# Patient Record
Sex: Female | Born: 1975 | Race: Black or African American | Hispanic: No | Marital: Single | Smoking: Former smoker
Health system: Southern US, Community
[De-identification: ages and names within clinical notes are randomized; demographics above are authoritative.]

## PROBLEM LIST (undated history)

## (undated) ENCOUNTER — Inpatient Hospital Stay (HOSPITAL_COMMUNITY): Payer: Self-pay

## (undated) DIAGNOSIS — R87629 Unspecified abnormal cytological findings in specimens from vagina: Secondary | ICD-10-CM

## (undated) DIAGNOSIS — O139 Gestational [pregnancy-induced] hypertension without significant proteinuria, unspecified trimester: Secondary | ICD-10-CM

## (undated) DIAGNOSIS — F32A Depression, unspecified: Secondary | ICD-10-CM

## (undated) DIAGNOSIS — D649 Anemia, unspecified: Secondary | ICD-10-CM

## (undated) DIAGNOSIS — F419 Anxiety disorder, unspecified: Secondary | ICD-10-CM

## (undated) DIAGNOSIS — F329 Major depressive disorder, single episode, unspecified: Secondary | ICD-10-CM

## (undated) HISTORY — PX: NO PAST SURGERIES: SHX2092

---

## 2016-04-04 ENCOUNTER — Inpatient Hospital Stay (HOSPITAL_COMMUNITY): Payer: Self-pay

## 2016-04-04 ENCOUNTER — Inpatient Hospital Stay (HOSPITAL_COMMUNITY)
Admission: AD | Admit: 2016-04-04 | Discharge: 2016-04-04 | Disposition: A | Discharge status: Home / Self Care | Payer: Self-pay | Source: Ambulatory Visit | Attending: Family Medicine | Admitting: Family Medicine

## 2016-04-04 ENCOUNTER — Encounter (HOSPITAL_COMMUNITY): Payer: Self-pay | Admitting: *Deleted

## 2016-04-04 DIAGNOSIS — O26892 Other specified pregnancy related conditions, second trimester: Secondary | ICD-10-CM

## 2016-04-04 DIAGNOSIS — K219 Gastro-esophageal reflux disease without esophagitis: Secondary | ICD-10-CM | POA: Insufficient documentation

## 2016-04-04 DIAGNOSIS — O99332 Smoking (tobacco) complicating pregnancy, second trimester: Secondary | ICD-10-CM | POA: Insufficient documentation

## 2016-04-04 DIAGNOSIS — O99612 Diseases of the digestive system complicating pregnancy, second trimester: Secondary | ICD-10-CM | POA: Insufficient documentation

## 2016-04-04 DIAGNOSIS — R109 Unspecified abdominal pain: Secondary | ICD-10-CM

## 2016-04-04 DIAGNOSIS — O99119 Other diseases of the blood and blood-forming organs and certain disorders involving the immune mechanism complicating pregnancy, unspecified trimester: Secondary | ICD-10-CM

## 2016-04-04 DIAGNOSIS — O0932 Supervision of pregnancy with insufficient antenatal care, second trimester: Secondary | ICD-10-CM | POA: Insufficient documentation

## 2016-04-04 DIAGNOSIS — O0930 Supervision of pregnancy with insufficient antenatal care, unspecified trimester: Secondary | ICD-10-CM

## 2016-04-04 DIAGNOSIS — O1202 Gestational edema, second trimester: Secondary | ICD-10-CM | POA: Insufficient documentation

## 2016-04-04 DIAGNOSIS — D649 Anemia, unspecified: Secondary | ICD-10-CM | POA: Insufficient documentation

## 2016-04-04 DIAGNOSIS — O26899 Other specified pregnancy related conditions, unspecified trimester: Secondary | ICD-10-CM

## 2016-04-04 DIAGNOSIS — O99012 Anemia complicating pregnancy, second trimester: Secondary | ICD-10-CM | POA: Insufficient documentation

## 2016-04-04 DIAGNOSIS — D696 Thrombocytopenia, unspecified: Secondary | ICD-10-CM | POA: Insufficient documentation

## 2016-04-04 DIAGNOSIS — Z3492 Encounter for supervision of normal pregnancy, unspecified, second trimester: Secondary | ICD-10-CM

## 2016-04-04 DIAGNOSIS — O99112 Other diseases of the blood and blood-forming organs and certain disorders involving the immune mechanism complicating pregnancy, second trimester: Secondary | ICD-10-CM | POA: Insufficient documentation

## 2016-04-04 DIAGNOSIS — Z3A19 19 weeks gestation of pregnancy: Secondary | ICD-10-CM | POA: Insufficient documentation

## 2016-04-04 HISTORY — DX: Gestational (pregnancy-induced) hypertension without significant proteinuria, unspecified trimester: O13.9

## 2016-04-04 HISTORY — DX: Depression, unspecified: F32.A

## 2016-04-04 HISTORY — DX: Anxiety disorder, unspecified: F41.9

## 2016-04-04 HISTORY — DX: Major depressive disorder, single episode, unspecified: F32.9

## 2016-04-04 HISTORY — DX: Unspecified abnormal cytological findings in specimens from vagina: R87.629

## 2016-04-04 LAB — COMPREHENSIVE METABOLIC PANEL
ALK PHOS: 53 U/L (ref 38–126)
ALT: 12 U/L — ABNORMAL LOW (ref 14–54)
ANION GAP: 7 (ref 5–15)
AST: 18 U/L (ref 15–41)
Albumin: 3.4 g/dL — ABNORMAL LOW (ref 3.5–5.0)
BUN: 6 mg/dL (ref 6–20)
CALCIUM: 8.8 mg/dL — AB (ref 8.9–10.3)
CO2: 23 mmol/L (ref 22–32)
Chloride: 106 mmol/L (ref 101–111)
Creatinine, Ser: 0.58 mg/dL (ref 0.44–1.00)
GFR calc non Af Amer: >60 mL/min (ref >60)
Glucose, Bld: 95 mg/dL (ref 65–99)
Potassium: 3.6 mmol/L (ref 3.5–5.1)
SODIUM: 136 mmol/L (ref 135–145)
TOTAL PROTEIN: 6.8 g/dL (ref 6.5–8.1)
Total Bilirubin: 0.4 mg/dL (ref 0.3–1.2)

## 2016-04-04 LAB — CBC
HCT: 30.1 % — ABNORMAL LOW (ref 36.0–46.0)
HEMOGLOBIN: 10.2 g/dL — AB (ref 12.0–15.0)
MCH: 31.2 pg (ref 26.0–34.0)
MCHC: 33.9 g/dL (ref 30.0–36.0)
MCV: 92 fL (ref 78.0–100.0)
Platelets: 122 10*3/uL — ABNORMAL LOW (ref 150–400)
RBC: 3.27 MIL/uL — ABNORMAL LOW (ref 3.87–5.11)
RDW: 12.9 % (ref 11.5–15.5)
WBC: 7.8 10*3/uL (ref 4.0–10.5)

## 2016-04-04 LAB — URINALYSIS, ROUTINE W REFLEX MICROSCOPIC
Bilirubin Urine: NEGATIVE
Glucose, UA: NEGATIVE mg/dL
Hgb urine dipstick: NEGATIVE
Ketones, ur: NEGATIVE mg/dL
LEUKOCYTES UA: NEGATIVE
Nitrite: NEGATIVE
PROTEIN: NEGATIVE mg/dL
Specific Gravity, Urine: 1.011 (ref 1.005–1.030)
pH: 7 (ref 5.0–8.0)

## 2016-04-04 LAB — TYPE AND SCREEN
ABO/RH(D): A POS
Antibody Screen: NEGATIVE

## 2016-04-04 LAB — WET PREP, GENITAL
SPERM: NONE SEEN
TRICH WET PREP: NONE SEEN
YEAST WET PREP: NONE SEEN

## 2016-04-04 LAB — ABO/RH: ABO/RH(D): A POS

## 2016-04-04 LAB — HEPATITIS B SURFACE ANTIGEN: Hepatitis B Surface Ag: NEGATIVE

## 2016-04-04 LAB — LIPASE, BLOOD: Lipase: 30 U/L (ref 11–51)

## 2016-04-04 MED ORDER — RANITIDINE HCL 150 MG PO TABS: 150.0000 mg | ORAL_TABLET | Freq: Every day | ORAL | 1 refills | Status: AC

## 2016-04-04 MED ORDER — GI COCKTAIL ~~LOC~~
30.0000 mL | Freq: Once | ORAL | Status: AC
  Administered 2016-04-04: 30 mL via ORAL
  Filled 2016-04-04: qty 30

## 2016-04-04 MED ORDER — FERROUS SULFATE 325 (65 FE) MG PO TABS: 325.0000 mg | ORAL_TABLET | Freq: Every day | ORAL | 3 refills | Status: DC

## 2016-04-04 NOTE — MAU Note (Signed)
Woke up, was swollen in hands, legs and feet.  Has been having pain on lower left side, comes and goes- started on Sat. Is nauseous all day. Lower back aches. No  Prenatal care yet, has appt to get medicaid certified at the end of the month.

## 2016-04-04 NOTE — Discharge Instructions (Signed)
Pregnancy and Anemia Anemia is a condition in which the concentration of red blood cells or hemoglobin in the blood is below normal. Hemoglobin is a substance in red blood cells that carries oxygen to the tissues of the body. Anemia results in not enough oxygen reaching these tissues. Anemia during pregnancy is common because the fetus uses more iron and folic acid as it is developing. Your body may not produce enough red blood cells because of this. Also, during pregnancy, the liquid part of the blood (plasma) increases by about 50%, and the red blood cells increase by only 25%. This lowers the concentration of the red blood cells and creates a natural anemia-like situation. What are the causes? The most common cause of anemia during pregnancy is not having enough iron in the body to make red blood cells (iron deficiency anemia). Other causes may include:  Folic acid deficiency.  Vitamin B12 deficiency.  Certain prescription or over-the-counter medicines.  Certain medical conditions or infections that destroy red blood cells.  A low platelet count and bleeding caused by antibodies that go through the placenta to the fetus from the mothers blood. What are the signs or symptoms? Mild anemia may not be noticeable. If it becomes severe, symptoms may include:  Tiredness.  Shortness of breath, especially with exercise.  Weakness.  Fainting.  Pale looking skin.  Headaches.  Feeling a fast or irregular heartbeat (palpitations). How is this diagnosed? The type of anemia is usually diagnosed from your family and medical history and blood tests. How is this treated? Treatment of anemia during pregnancy depends on the cause of the anemia. Treatment can include:  Supplements of iron, vitamin B12, or folic acid.  A blood transfusion. This may be needed if blood loss is severe.  Hospitalization. This may be needed if there is significant continual blood loss.  Dietary changes. Follow  these instructions at home:  Follow your dietitian's or health care provider's dietary recommendations.  Increase your vitamin C intake. This will help the stomach absorb more iron.  Eat a diet rich in iron. This would include foods such as:  Liver.  Beef.  Whole grain bread.  Eggs.  Dried fruit.  Take iron and vitamins as directed by your health care provider.  Eat green leafy vegetables. These are a good source of folic acid. Contact a health care provider if:  You have frequent or lasting headaches.  You are looking pale.  You are bruising easily. Get help right away if:  You have extreme weakness, shortness of breath, or chest pain.  You become dizzy or have trouble concentrating.  You have heavy vaginal bleeding.  You develop a rash.  You have bloody or black, tarry stools.  You faint.  You vomit up blood.  You vomit repeatedly.  You have abdominal pain.  You have a fever or persistent symptoms for more than 2-3 days.  You have a fever and your symptoms suddenly get worse.  You are dehydrated. This information is not intended to replace advice given to you by your health care provider. Make sure you discuss any questions you have with your health care provider. Document Released: 04/13/2000 Document Revised: 09/22/2015 Document Reviewed: 11/26/2012 Elsevier Interactive Patient Education  2017 Elsevier Inc. Gastroesophageal Reflux Disease, Adult Introduction Normally, food travels down the esophagus and stays in the stomach to be digested. If a person has gastroesophageal reflux disease (GERD), food and stomach acid move back up into the esophagus. When this happens, the esophagus becomes  sore and swollen (inflamed). Over time, GERD can make small holes (ulcers) in the lining of the esophagus. Follow these instructions at home: Diet  Follow a diet as told by your doctor. You may need to avoid foods and drinks such as:  Coffee and tea (with or  without caffeine).  Drinks that contain alcohol.  Energy drinks and sports drinks.  Carbonated drinks or sodas.  Chocolate and cocoa.  Peppermint and mint flavorings.  Garlic and onions.  Horseradish.  Spicy and acidic foods, such as peppers, chili powder, curry powder, vinegar, hot sauces, and BBQ sauce.  Citrus fruit juices and citrus fruits, such as oranges, lemons, and limes.  Tomato-based foods, such as red sauce, chili, salsa, and pizza with red sauce.  Fried and fatty foods, such as donuts, french fries, potato chips, and high-fat dressings.  High-fat meats, such as hot dogs, rib eye steak, sausage, ham, and bacon.  High-fat dairy items, such as whole milk, butter, and cream cheese.  Eat small meals often. Avoid eating large meals.  Avoid drinking large amounts of liquid with your meals.  Avoid eating meals during the 2-3 hours before bedtime.  Avoid lying down right after you eat.  Do not exercise right after you eat. General instructions  Pay attention to any changes in your symptoms.  Take over-the-counter and prescription medicines only as told by your doctor. Do not take aspirin, ibuprofen, or other NSAIDs unless your doctor says it is okay.  Do not use any tobacco products, including cigarettes, chewing tobacco, and e-cigarettes. If you need help quitting, ask your doctor.  Wear loose clothes. Do not wear anything tight around your waist.  Raise (elevate) the head of your bed about 6 inches (15 cm).  Try to lower your stress. If you need help doing this, ask your doctor.  If you are overweight, lose an amount of weight that is healthy for you. Ask your doctor about a safe weight loss goal.  Keep all follow-up visits as told by your doctor. This is important. Contact a doctor if:  You have new symptoms.  You lose weight and you do not know why it is happening.  You have trouble swallowing, or it hurts to swallow.  You have wheezing or a cough  that keeps happening.  Your symptoms do not get better with treatment.  You have a hoarse voice. Get help right away if:  You have pain in your arms, neck, jaw, teeth, or back.  You feel sweaty, dizzy, or light-headed.  You have chest pain or shortness of breath.  You throw up (vomit) and your throw up looks like blood or coffee grounds.  You pass out (faint).  Your poop (stool) is bloody or black.  You cannot swallow, drink, or eat. This information is not intended to replace advice given to you by your health care provider. Make sure you discuss any questions you have with your health care provider. Document Released: 10/03/2007 Document Revised: 09/22/2015 Document Reviewed: 08/11/2014  2017 Elsevier

## 2016-04-04 NOTE — MAU Provider Note (Signed)
History     CSN: [id number]  Arrival date and time: 04/04/16 4098   First Provider Initiated Contact with Patient 04/04/16 1033      Chief Complaint  Patient presents with  . Leg Swelling  . side pain  . Back Pain  . Nausea   [id number] @[redacted]w[redacted]d  by unsure LMP here with swelling of hands and feet and abdominal pain. She reports onset of pain this am. She describes as constant with intermittent worsening in the left mid abdomen. She reports nausea since last month, no vomiting this week. She denies diarrhea but reports loose stools x4 days. Swelling also started this am. She denies fever and chills. She endorses dysuria x4 days. She denies urgency, frequency, and hematuria. She has not started [facility] to date as awaiting pregnancy Medicaid. She reports hx of elevated BP and low platelets with her 4th pregnancy. All others pregnancies were term, uncomplicaed with SVDs.    OB History    Gravida Para Term Preterm AB Living   6 5 5  0 0 4   SAB TAB Ectopic Multiple Live Births   0 0 0 0 5      Past Medical History:  Diagnosis Date  . Anxiety    after 5th preg  . Depression    after death of child, ok now  . Pregnancy induced hypertension   . Vaginal Pap smear, abnormal    ok since    Past Surgical History:  Procedure Laterality Date  . NO PAST SURGERIES      Family History  Problem Relation Age of Onset  . Hypertension Mother   . Diabetes Mother   . Diabetes Maternal Grandmother   . Stroke Maternal Grandfather     Social History  Substance Use Topics  . Smoking status: Current Some Day Smoker  . Smokeless tobacco: Never Used     Comment: rare, social  . Alcohol use No    Allergies: Allergies not on file  No prescriptions prior to admission.    Review of Systems  Constitutional: Negative.   Gastrointestinal: Positive for abdominal pain and nausea. Negative for constipation, diarrhea and vomiting.  Genitourinary: Positive for dysuria. Negative for flank pain,  frequency, hematuria and urgency.   Physical Exam   Blood pressure 130/72, pulse 75, temperature 98.2 F (36.8 C), temperature source Oral, resp. rate 18, weight 73.1 kg (161 lb 4 oz). Temp:  [98.2 F (36.8 C)] 98.2 F (36.8 C) (12/06 1013) Pulse Rate:  [71-75] 74 (12/06 1354) Resp:  [16-18] 16 (12/06 1354) BP: (124-130)/(72-73) 124/72 (12/06 1354) Weight:  [73.1 kg (161 lb 4 oz)] 73.1 kg (161 lb 4 oz) (12/06 1013)  Physical Exam  Constitutional: She is oriented to person, place, and time. She appears well-developed and well-nourished. No distress (appears comfortable).  HENT:  Head: Normocephalic and atraumatic.  Neck: Normal range of motion. Neck supple.  Cardiovascular: Normal rate.   Respiratory: Effort normal.  GI: Soft. She exhibits no distension and no mass. There is no tenderness. There is no rebound and no guarding.  gravid  Genitourinary:  Genitourinary Comments: External: no lesions or erythema Vagina: rugated, parous, thin white discharge Uterus: enlarged SVE: closed/uneffaced  Musculoskeletal: Normal range of motion. She exhibits no edema.  Neurological: She is alert and oriented to person, place, and time.  Skin: Skin is warm and dry.  Psychiatric: She has a normal mood and affect.  FHT: 148 bpm  Results for orders placed or performed during the hospital encounter of 04/04/16 (  from the past 24 hour(s))  Wet prep, genital     Status: Abnormal   Collection Time: 04/04/16 10:52 AM  Result Value Ref Range   Yeast Wet Prep HPF POC NONE SEEN NONE SEEN   Trich, Wet Prep NONE SEEN NONE SEEN   Clue Cells Wet Prep HPF POC PRESENT (A) NONE SEEN   WBC, Wet Prep HPF POC MANY (A) NONE SEEN   Sperm NONE SEEN   Urinalysis, Routine w reflex microscopic     Status: None   Collection Time: 04/04/16 11:07 AM  Result Value Ref Range   Color, Urine YELLOW YELLOW   APPearance CLEAR CLEAR   Specific Gravity, Urine 1.011 1.005 - 1.030   pH 7.0 5.0 - 8.0   Glucose, UA NEGATIVE  NEGATIVE mg/dL   Hgb urine dipstick NEGATIVE NEGATIVE   Bilirubin Urine NEGATIVE NEGATIVE   Ketones, ur NEGATIVE NEGATIVE mg/dL   Protein, ur NEGATIVE NEGATIVE mg/dL   Nitrite NEGATIVE NEGATIVE   Leukocytes, UA NEGATIVE NEGATIVE  CBC     Status: Abnormal   Collection Time: 04/04/16 11:09 AM  Result Value Ref Range   WBC 7.8 4.0 - 10.5 K/uL   RBC 3.27 (L) 3.87 - 5.11 MIL/uL   Hemoglobin 10.2 (L) 12.0 - 15.0 g/dL   HCT 78.230.1 (L) 95.636.0 - 21.346.0 %   MCV 92.0 78.0 - 100.0 fL   MCH 31.2 26.0 - 34.0 pg   MCHC 33.9 30.0 - 36.0 g/dL   RDW 08.612.9 57.811.5 - 46.915.5 %   Platelets 122 (L) 150 - 400 K/uL  Comprehensive metabolic panel     Status: Abnormal   Collection Time: 04/04/16 11:09 AM  Result Value Ref Range   Sodium 136 135 - 145 mmol/L   Potassium 3.6 3.5 - 5.1 mmol/L   Chloride 106 101 - 111 mmol/L   CO2 23 22 - 32 mmol/L   Glucose, Bld 95 65 - 99 mg/dL   BUN 6 6 - 20 mg/dL   Creatinine, Ser 6.290.58 0.44 - 1.00 mg/dL   Calcium 8.8 (L) 8.9 - 10.3 mg/dL   Total Protein 6.8 6.5 - 8.1 g/dL   Albumin 3.4 (L) 3.5 - 5.0 g/dL   AST 18 15 - 41 U/L   ALT 12 (L) 14 - 54 U/L   Alkaline Phosphatase 53 38 - 126 U/L   Total Bilirubin 0.4 0.3 - 1.2 mg/dL   GFR calc non Af Amer >60 >60 mL/min   GFR calc Af Amer >60 >60 mL/min   Anion gap 7 5 - 15  Lipase, blood     Status: None   Collection Time: 04/04/16 11:09 AM  Result Value Ref Range   Lipase 30 11 - 51 U/L  Type and screen     Status: None   Collection Time: 04/04/16 11:10 AM  Result Value Ref Range   ABO/RH(D) A POS    Antibody Screen NEG    Sample Expiration 04/07/2016   ABO/Rh     Status: None   Collection Time: 04/04/16 11:20 AM  Result Value Ref Range   ABO/RH(D) A POS     MAU Course  Procedures GI cocktail MDM Labs and US ordered and reviewed. Pain improved after GI cocktail. No evidence of acute abdominal or pelvic process. Stable for discharge home.  Assessment and Plan   1. Gastroesophageal reflux disease without esophagitis    2. No prenatal care in current pregnancy   3. Abdominal pain affecting pregnancy   4. 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 Second trimester pregnancy   6. Thrombocytopenia affecting pregnancy (HCC)   7. Anemia during pregnancy in second trimester    Discharge home Follow up in [facility] in 1-2 weeks to [facility] worsening sx    Medication List    STOP taking these medications   ibuprofen 200 MG tablet Commonly known as:  ADVIL,MOTRIN     TAKE these medications   ferrous sulfate 325 (65 FE) MG tablet Take 1 tablet (325 mg total) by mouth daily.   loratadine 10 MG tablet Commonly known as:  CLARITIN Take 10 mg by mouth daily as needed for allergies.   prenatal multivitamin Tabs tablet Take 1 tablet by mouth daily at 12 noon.   ranitidine 150 MG tablet Commonly known as:  ZANTAC Take 1 tablet (150 mg total) by mouth at bedtime.      Donette LarryMelanie Gem Conkle, CNM 04/04/2016, 10:36 AM

## 2016-04-04 NOTE — MAU Note (Signed)
Urine in lab 

## 2016-04-05 LAB — GC/CHLAMYDIA PROBE AMP (~~LOC~~) NOT AT ARMC
CHLAMYDIA, DNA PROBE: NEGATIVE
NEISSERIA GONORRHEA: NEGATIVE

## 2016-04-05 LAB — HIV ANTIBODY (ROUTINE TESTING W REFLEX): HIV Screen 4th Generation wRfx: NONREACTIVE

## 2016-04-05 LAB — RPR: RPR: NONREACTIVE

## 2016-04-05 LAB — RUBELLA SCREEN: RUBELLA: 2.06 {index} (ref >0.99)

## 2016-04-10 LAB — DRUG PROFILE, UR, 9 DRUGS (LABCORP)
Amphetamines, Urine: NEGATIVE ng/mL
BARBITURATE, UR: NEGATIVE ng/mL
BENZODIAZEPINE QUANT UR: NEGATIVE ng/mL
Cannabinoid Quant, Ur: POSITIVE — AB
Cocaine (Metab.): NEGATIVE ng/mL
Methadone Screen, Urine: NEGATIVE ng/mL
Opiate Quant, Ur: NEGATIVE ng/mL
PROPOXYPHENE, URINE: NEGATIVE ng/mL
Phencyclidine, Ur: NEGATIVE ng/mL

## 2016-04-11 ENCOUNTER — Encounter: Payer: Self-pay | Admitting: Family Medicine

## 2016-04-11 DIAGNOSIS — O99323 Drug use complicating pregnancy, third trimester: Secondary | ICD-10-CM | POA: Insufficient documentation

## 2016-04-24 ENCOUNTER — Encounter: Payer: Self-pay | Admitting: Student

## 2016-04-24 ENCOUNTER — Ambulatory Visit (INDEPENDENT_AMBULATORY_CARE_PROVIDER_SITE_OTHER): Payer: Self-pay | Admitting: Student

## 2016-04-24 ENCOUNTER — Encounter: Payer: Self-pay | Admitting: Family Medicine

## 2016-04-24 ENCOUNTER — Ambulatory Visit (INDEPENDENT_AMBULATORY_CARE_PROVIDER_SITE_OTHER): Payer: Self-pay | Admitting: Clinical

## 2016-04-24 VITALS — BP 111/78 | HR 70 | Temp 97.8°F | Ht 65.0 in | Wt 161.6 lb

## 2016-04-24 DIAGNOSIS — O0990 Supervision of high risk pregnancy, unspecified, unspecified trimester: Secondary | ICD-10-CM | POA: Insufficient documentation

## 2016-04-24 DIAGNOSIS — O91119 Abscess of breast associated with pregnancy, unspecified trimester: Secondary | ICD-10-CM

## 2016-04-24 DIAGNOSIS — O91112 Abscess of breast associated with pregnancy, second trimester: Secondary | ICD-10-CM

## 2016-04-24 DIAGNOSIS — O99119 Other diseases of the blood and blood-forming organs and certain disorders involving the immune mechanism complicating pregnancy, unspecified trimester: Secondary | ICD-10-CM

## 2016-04-24 DIAGNOSIS — D649 Anemia, unspecified: Secondary | ICD-10-CM

## 2016-04-24 DIAGNOSIS — O99019 Anemia complicating pregnancy, unspecified trimester: Secondary | ICD-10-CM | POA: Insufficient documentation

## 2016-04-24 DIAGNOSIS — O99112 Other diseases of the blood and blood-forming organs and certain disorders involving the immune mechanism complicating pregnancy, second trimester: Secondary | ICD-10-CM

## 2016-04-24 DIAGNOSIS — Z3492 Encounter for supervision of normal pregnancy, unspecified, second trimester: Secondary | ICD-10-CM

## 2016-04-24 DIAGNOSIS — F4323 Adjustment disorder with mixed anxiety and depressed mood: Secondary | ICD-10-CM

## 2016-04-24 DIAGNOSIS — O99012 Anemia complicating pregnancy, second trimester: Secondary | ICD-10-CM

## 2016-04-24 DIAGNOSIS — D696 Thrombocytopenia, unspecified: Secondary | ICD-10-CM

## 2016-04-24 LAB — POCT URINALYSIS DIP (DEVICE)
BILIRUBIN URINE: NEGATIVE
Glucose, UA: NEGATIVE mg/dL
Hgb urine dipstick: NEGATIVE
KETONES UR: NEGATIVE mg/dL
Leukocytes, UA: NEGATIVE
Nitrite: NEGATIVE
Protein, ur: NEGATIVE mg/dL
SPECIFIC GRAVITY, URINE: 1.01 (ref 1.005–1.030)
UROBILINOGEN UA: 0.2 mg/dL (ref 0.0–1.0)
pH: 7.5 (ref 5.0–8.0)

## 2016-04-24 MED ORDER — CEPHALEXIN 500 MG PO CAPS: 500.0000 mg | ORAL_CAPSULE | Freq: Four times a day (QID) | ORAL | 0 refills | Status: AC

## 2016-04-24 NOTE — Patient Instructions (Signed)
AREA PEDIATRIC/FAMILY PRACTICE PHYSICIANS   [facility] [address], [address] [place], [place]  [address] Phone - [phone]   Fax - [phone]  [facility] [address] [place], [place] [address] Phone - [phone]   Fax - [phone]  [facility] [address] [place], [place]  [address] Phone - [phone]   Fax - [phone]  [facility] [address], [address] [place], [place]  [address] Phone - [phone]   Fax - [phone]  [facility] [address] [place], [place]  [address] Phone - [phone]   Fax - [phone]  [facility] [address], [address] [place], [place]  [address] Phone - [phone]   Fax - [phone]  [facility] [address], [address] [place], [place]  [address] Phone - [phone]   Fax - [phone]  [facility] [address] [place], [place]  [address] Phone - [phone]   Fax - [phone]  [facility] [address] [place], [place]  [address] Phone - [phone]   Fax - [phone] [facility] [address] [place], [place]  [address] Phone - [phone]   Fax - [phone]  [facility] [address] [place], [place]  [address] Phone - [phone]   Fax - [phone]  [facility] [address], [address] [place], [place]  [address] Phone - [phone]   Fax - [phone]  [facility] [address], [address] [place], [place]  [address] Phone - [phone]   Fax - [phone]  [facility] [address], [address] [place], [place]  [address] Phone - [phone]  [facility] [address] [place], [place]  [address] Phone - [phone]   Fax - [phone]  [facility] [address], [address] [place], [place]  [address] Phone - [phone]   Fax - [phone]  [facility] [address] [place], [place]  [address] Phone - [phone]   Fax - [phone]  [facility] [address] [place], [place]  [address] Phone - [phone]   Fax - [phone]   [facility] [address] [place], [place]  [address] Phone - [phone]   Fax - [phone]  [facility] [address], [address] [place], [place]  [address] Phone - [phone]   Fax - [phone]  DAVID RUBIN [address] [place], [place]  [address] Phone - [phone]   Fax - [phone]  [facility] [address], [address] [place], [place]  [address] Phone - [phone]   Fax - [phone]  [facility] [address] [place], [place]  [address] Phone - [phone]   Fax - [phone] [facility] [place] [address] [place], [place]  [address] Phone - [phone]   Fax - [phone]  [facility] [address] [place], [place]  [address] Phone - [phone]   Fax - [phone]  [facility] [place] [address] [place], [place]  [address] Phone - [phone]   Fax - [phone]    Second Trimester of Pregnancy The second trimester is from week 13 through week 28, month 4 through 6. This is often the time in pregnancy that you feel your best. Often times,  morning sickness has lessened or quit. You may have more energy, and you may get hungry more often. Your unborn baby (fetus) is growing rapidly. At the end of the sixth month, he or she is about 9 inches long and weighs about 1 pounds. You will likely feel the baby move (quickening) between 18 and 20 weeks of pregnancy. Follow these instructions at home:  Avoid all smoking, herbs, and alcohol. Avoid drugs not approved by your doctor.  Do not use any  tobacco products, including cigarettes, chewing tobacco, and electronic cigarettes. If you need help quitting, ask your doctor. You may get counseling or other support to help you quit.  Only take medicine as told by your doctor. Some medicines are safe and some are not during pregnancy.  Exercise only as told by your doctor. Stop exercising if you start having cramps.  Eat regular, healthy meals.  Wear a good support bra if your breasts are tender.  Do not use hot tubs, steam rooms, or saunas.  Wear your seat belt when driving.  Avoid raw meat, uncooked cheese, and liter boxes and soil used by cats.  Take your prenatal vitamins.  Take 1500-2000 milligrams of calcium daily starting at the 20th week of pregnancy until you deliver your baby.  Try taking medicine that helps you poop (stool softener) as needed, and if your doctor approves. Eat more fiber by eating fresh fruit, vegetables, and whole grains. Drink enough fluids to keep your pee (urine) clear or pale yellow.  Take warm water baths (sitz baths) to soothe pain or discomfort caused by hemorrhoids. Use hemorrhoid cream if your doctor approves.  If you have puffy, bulging veins (varicose veins), wear support hose. Raise (elevate) your feet for 15 minutes, 3-4 times a day. Limit salt in your diet.  Avoid heavy lifting, wear low heals, and sit up straight.  Rest with your legs raised if you have leg cramps or low back pain.  Visit your dentist if you have not gone during your pregnancy. Use a soft toothbrush to brush your teeth. Be gentle when you floss.  You can have sex (intercourse) unless your doctor tells you not to.  Go to your doctor visits. Get help if:  You feel dizzy.  You have mild cramps or pressure in your lower belly (abdomen).  You have a nagging pain in your belly area.  You continue to feel sick to your stomach (nauseous), throw up (vomit), or have watery poop (diarrhea).  You have bad smelling fluid  coming from your vagina.  You have pain with peeing (urination). Get help right away if:  You have a fever.  You are leaking fluid from your vagina.  You have spotting or bleeding from your vagina.  You have severe belly cramping or pain.  You lose or gain weight rapidly.  You have trouble catching your breath and have chest pain.  You notice sudden or extreme puffiness (swelling) of your face, hands, ankles, feet, or legs.  You have not felt the baby move in over an hour.  You have severe headaches that do not go away with medicine.  You have vision changes. This information is not intended to replace advice given to you by your health care provider. Make sure you discuss any questions you have with your health care provider. Document Released: 07/11/2009 Document Revised: 09/22/2015 Document Reviewed: 06/17/2012 Elsevier Interactive Patient Education  2017 [employer]

## 2016-04-24 NOTE — Progress Notes (Signed)
Pt reports a slight discomfort on her left side rating it at a 3-4. Nothing makes it worse or better. Pt states she thinks she has a blocked duct in her right breast (area is red and swollen and tender to touch).   Patient states she feels safe in current relationship Denies being hurt Denies Hurting self or others   Pt will need to see Asher MuirJamie in [facility]

## 2016-04-24 NOTE — BH Specialist Note (Signed)
Session Start time: 10:58   End Time: 11:28 Total Time:  30 minutes Type of Service: Behavioral Health - Individual/Family Interpreter: No.   Interpreter Name & Language: n/a # [facility]  SUBJECTIVE: Tanya Villegas is a 40 y.o. female  Pt. was referred by Luna KitchensKathryn Kooistra, CNM for:  anxiety and depression. Pt. reports the following symptoms/concerns: Pt states that she is feeling an increase in stress this pregnancy, because of lack of social support in [place](from [place]), first Christmas away from older children, anniversary of passing of child in January; moved to [place] to help care for her mother. Also anemic during pregnancy. Duration of problem:  Less than 6 months Severity: moderate Previous treatment: none  OBJECTIVE: Mood: Depressed & Affect: Tearful Risk of harm to self or others: No known risk of harm to self or others Assessments administered: PHQ9: 10/ GAD7: 9  LIFE CONTEXT:  Family & Social: Lives with husband and 2 youngest children, oldest 2 in [place] (college students); mother Nurse, learning disabilitylocal  School/ Work: Husband working fulltime Self-Care: No issue with self-care  Life changes: Current pregnancy, recent move to [place] What is important to pt/family (values): Overall wellbeing  GOALS ADDRESSED:  -Reduce symptoms of anxiety and depresssion  INTERVENTIONS: Solution Focused and Strength-based   ASSESSMENT:  Pt currently experiencing Adjustment disorder with mixed anxious and depressed mood.  Pt may benefit from psychoeducation and brief therapeutic intervnetion regarding coping with symptoms of anxiety and depression.   PLAN: 1. F/U with behavioral health clinician: One month 2. Behavioral Health meds: none 3. Behavioral recommendations:  -Go to Department of Social Services to apply for full Medicaid this coming week -Read educational material regarding coping with symptoms of anxiety and depression -Consider [facility] as additional community  resource  -Consider apps as additional self-care strategies 4. Referral: Brief Counseling/Psychotherapy, Publishing rights managerCommunity Resource and Psychoeducation 5. From scale of 1-10, how likely are you to follow plan: 9  Woc-Behavioral Health Clinician  Behavioral Health Clinician  Marlon PelWarmhandoff:   Warm Hand Off Completed.        Depression screen PHQ 2/9 04/24/2016  Decreased Interest 2  Down, Depressed, Hopeless 1  PHQ - 2 Score 3  Altered sleeping 1  Tired, decreased energy 2  Change in appetite 2  Feeling bad or failure about yourself  1  Trouble concentrating 1  Moving slowly or fidgety/restless 0  Suicidal thoughts 0  PHQ-9 Score 10   GAD 7 : Generalized Anxiety Score 04/24/2016  Nervous, Anxious, on Edge 2  Control/stop worrying 1  Worry too much - different things 1  Trouble relaxing 2  Restless 0  Easily annoyed or irritable 1  Afraid - awful might happen 2  Total GAD 7 Score 9

## 2016-04-24 NOTE — Progress Notes (Signed)
  Subjective:    Tanya Villegas is being seen today for her first obstetrical visit.  This not a planned pregnancy. She is at 6530w6d gestation. Her obstetrical history is significant for patient states she developed high blood pressure at 35 weeks during one of her pregnancies but she was not induced for it. . Relationship with FOB: spouse, living together. Patient does intend to breast feed. Pregnancy history fully reviewed.  Patient reports painful abscess in her right breast.   Review of Systems:   Review of Systems  Constitutional: Negative.   HENT: Negative.   Eyes: Negative.   Respiratory: Negative.   Cardiovascular: Negative.   Gastrointestinal: Negative.   Endocrine: Negative.   Genitourinary: Negative.   Musculoskeletal: Negative.   Skin: Negative.   Allergic/Immunologic: Negative.   Neurological: Negative.   Hematological: Negative.   Psychiatric/Behavioral:       Patient will see Asher MuirJamie because of elevated GADD and PHQ9    Objective:     BP 111/78   Pulse 70   Temp 97.8 F (36.6 C)   Ht 5\' 5"  (1.651 m)   Wt 73.3 kg (161 lb 9.6 oz)   LMP 11/17/2015   BMI 26.89 kg/m  Physical Exam  Constitutional: She is oriented to person, place, and time. She appears well-developed and well-nourished.  HENT:  Head: Normocephalic.  Eyes: Pupils are equal, round, and reactive to light.  Neck: Normal range of motion.  Cardiovascular: Normal rate.   Respiratory: Effort normal and breath sounds normal. Right breast exhibits mass and tenderness.  GI: Soft. She exhibits no distension and no mass. There is no tenderness. There is no rebound and no guarding.  Genitourinary: Rectal exam shows guaiac negative stool.  Musculoskeletal: Normal range of motion.  Neurological: She is alert and oriented to person, place, and time.  Skin: Skin is warm and dry.  Patient has a quarter-sized indurated abscess at 3 o'clock at the edge of her aerola. Slightly erythematous and tender to palpation.  Patient is afebrile, denies chills, malaise or fatigue.   Psychiatric: She has a normal mood and affect. Her behavior is normal.    Exam    Assessment:    Pregnancy: [id number] Patient Active Problem List   Diagnosis Date Noted  . Supervision of low-risk pregnancy, second trimester 04/24/2016  . Anemia in pregnancy 04/24/2016  . Gestational thrombocytopenia (HCC) 04/24/2016  . Substance abuse affecting pregnancy in third trimester, antepartum 04/11/2016       Plan:     Some prenatal labs drawn in [facility] on 04/04/2016. Rest to be drawn on 05/08/2016.  Prenatal vitamins. Continue to take iron for anemia Monitor platelets in each trimester GTT on 05/08/2016 Role of ultrasound in pregnancy discussed; fetal survey: results reviewed. Keflex for plugged duct/breast abscess and follow up in 1 weeks for I and D of the breast absess. GTT in two weeks.  [place]atal records requested from [place] provider.  80% of 40 min visit spent on counseling and coordination of care.    Charlesetta GaribaldiKathryn Lorraine Kooistra CNM 04/24/2016

## 2016-04-30 NOTE — L&D Delivery Note (Signed)
Delivery Note Arrived in [facility] with water leaking as she walked in.  Shortly thereafter head began to emerge.  They called me and Dr Macon Large.  I arrived as baby was born.   At 2:23 AM a viable and healthy female was delivered via Vaginal, Spontaneous Delivery (Presentation:OA  ).  APGAR: 9, 9; weight  .   Placenta status: spontaneous and grossly intact with 2v Cord:  with the following complications: nuchal x 1 (delivered through)  Anesthesia:   Episiotomy: None Lacerations:  none Suture Repair: none Est. Blood Loss (mL): 100  Mom to postpartum.  Baby to Couplet care / Skin to Skin.  Wynelle Bourgeois 07/29/2016, 2:59 AM

## 2016-05-01 ENCOUNTER — Ambulatory Visit (INDEPENDENT_AMBULATORY_CARE_PROVIDER_SITE_OTHER): Payer: Self-pay | Admitting: Student

## 2016-05-01 VITALS — BP 111/69 | HR 80 | Temp 98.6°F | Wt 165.9 lb

## 2016-05-01 DIAGNOSIS — O91119 Abscess of breast associated with pregnancy, unspecified trimester: Secondary | ICD-10-CM

## 2016-05-01 DIAGNOSIS — O91112 Abscess of breast associated with pregnancy, second trimester: Secondary | ICD-10-CM

## 2016-05-01 MED ORDER — CEPHALEXIN 500 MG PO CAPS: 500.0000 mg | ORAL_CAPSULE | Freq: Four times a day (QID) | ORAL | 0 refills | Status: AC

## 2016-05-01 NOTE — Progress Notes (Signed)
B[facility]CP appt scheduled for January 4th @ 0815 for breast abscess.  Pt notified.

## 2016-05-01 NOTE — Progress Notes (Signed)
Patient Tanya Villegas is a [id number] at 27 weeks and 6 days being seen today for follow up from a breast abscess on her right breast near the inner nipple line. Patient was seen last week for her New OB appointment and breast abscess was observed. Patient was given a course of Keflex (500 mg QID for 10 days). She has 4 days remaining of her antibiotic course. Breast abscess is much improved with antibiotics. No redness or heat observed however there is a fluctuant quarter-sized mass still palpable in the areola. Patient denies pain, fever, chills. After discussing with Dr. Adrian BlackwaterStinson, patient will continue antibiotics for 4 more days (for a total of 14 days of treatment) and will schedule a [facility]/2016.

## 2016-05-01 NOTE — Patient Instructions (Signed)
° °Mastitis °Mastitis is inflammation of the breast tissue. It occurs most often in women who are breastfeeding, but it can also affect other women, and even sometimes men. °CAUSES  °Mastitis is usually caused by a bacterial infection. Bacteria enter the breast tissue through cuts or openings in the skin. Typically, this occurs with breastfeeding because of cracked or irritated skin. Sometimes, it can occur even when there is no opening in the skin. It can be associated with plugged milk (lactiferous) ducts. Nipple piercing can also lead to mastitis. Also, some forms of breast cancer can cause mastitis. °SIGNS AND SYMPTOMS  °· Swelling, redness, tenderness, and pain in an area of the breast. °· Swelling of the glands under the arm on the same side. °· Fever. °If an infection is allowed to progress, a collection of pus (abscess) may develop. °DIAGNOSIS  °Your health care provider can usually diagnose mastitis based on your symptoms and a physical exam. Tests may be done to help confirm the diagnosis. These may include:  °· Removal of pus from the breast by applying pressure to the area. This pus can be examined in the lab to determine which bacteria are present. If an abscess has developed, the fluid in the abscess can be removed with a needle. This can also be used to confirm the diagnosis and determine the bacteria present. In most cases, pus will not be present. °· Blood tests to determine if your body is fighting a bacterial infection. °· Mammogram or ultrasound tests to rule out other problems or diseases. °TREATMENT  °Antibiotic medicine is used to treat a bacterial infection. Your health care provider will determine which bacteria are most likely causing the infection and will select an appropriate antibiotic. This is sometimes changed based on the results of tests performed to identify the bacteria, or if there is no response to the antibiotic selected. Antibiotics are usually given by mouth. You may also be  given medicine for pain. °Mastitis that occurs with breastfeeding will sometimes go away on its own, so your health care provider may choose to wait 24 hours after first seeing you to decide whether a prescription medicine is needed. °HOME CARE INSTRUCTIONS  °· Only take over-the-counter or prescription medicines for pain, fever, or discomfort as directed by your health care provider. °· If your health care provider prescribed an antibiotic, take the medicine as directed. Make sure you finish it even if you start to feel better. °· Do not wear a tight or underwire bra. Wear a soft, supportive bra. °· Increase your fluid intake, especially if you have a fever. °· Women who are breastfeeding should follow these instructions: °¨ Continue to empty the breast. Your health care provider can tell you whether this milk is safe for your infant or needs to be thrown out. You may be told to stop nursing until your health care provider thinks it is safe for your baby. Use a breast pump if you are advised to stop nursing. °¨ Keep your nipples clean and dry. °¨ Empty the first breast completely before going to the other breast. If your baby is not emptying your breasts completely for some reason, use a breast pump to empty your breasts. °¨ If you go back to work, pump your breasts while at work to stay in time with your nursing schedule. °¨ Avoid allowing your breasts to become overly filled with milk (engorged). °SEEK MEDICAL CARE IF:  °· You have pus-like discharge from the breast. °· Your symptoms do   not improve with the treatment prescribed by your health care provider within 2 days. °SEEK IMMEDIATE MEDICAL CARE IF:  °· Your pain and swelling are getting worse. °· You have pain that is not controlled with medicine. °· You have a red line extending from the breast toward your armpit. °· You have a fever or persistent symptoms for more than 2-3 days. °· You have a fever and your symptoms suddenly get worse. °This information is  not intended to replace advice given to you by your health care provider. Make sure you discuss any questions you have with your health care provider. °Document Released: 04/16/2005 Document Revised: 04/21/2013 Document Reviewed: 11/14/2012 °Elsevier Interactive Patient Education © 2017 Elsevier Inc. ° ° °

## 2016-05-03 ENCOUNTER — Other Ambulatory Visit (HOSPITAL_COMMUNITY): Payer: Self-pay | Admitting: Obstetrics and Gynecology

## 2016-05-03 ENCOUNTER — Ambulatory Visit
Admission: RE | Admit: 2016-05-03 | Discharge: 2016-05-03 | Disposition: A | Discharge status: Home / Self Care | Payer: No Typology Code available for payment source | Source: Ambulatory Visit | Attending: Obstetrics and Gynecology | Admitting: Obstetrics and Gynecology

## 2016-05-03 ENCOUNTER — Other Ambulatory Visit (HOSPITAL_COMMUNITY): Payer: Self-pay | Admitting: *Deleted

## 2016-05-03 ENCOUNTER — Encounter (HOSPITAL_COMMUNITY): Payer: Self-pay

## 2016-05-03 ENCOUNTER — Ambulatory Visit (HOSPITAL_COMMUNITY)
Admission: RE | Admit: 2016-05-03 | Discharge: 2016-05-03 | Disposition: A | Discharge status: Home / Self Care | Payer: Self-pay | Source: Ambulatory Visit | Attending: Obstetrics and Gynecology | Admitting: Obstetrics and Gynecology

## 2016-05-03 VITALS — BP 116/80 | Temp 98.5°F | Ht 64.25 in | Wt 164.8 lb

## 2016-05-03 DIAGNOSIS — O91119 Abscess of breast associated with pregnancy, unspecified trimester: Secondary | ICD-10-CM

## 2016-05-03 DIAGNOSIS — N6310 Unspecified lump in the right breast, unspecified quadrant: Secondary | ICD-10-CM

## 2016-05-03 DIAGNOSIS — N6315 Unspecified lump in the right breast, overlapping quadrants: Secondary | ICD-10-CM

## 2016-05-03 DIAGNOSIS — N611 Abscess of the breast and nipple: Secondary | ICD-10-CM

## 2016-05-03 DIAGNOSIS — Z1239 Encounter for other screening for malignant neoplasm of breast: Secondary | ICD-10-CM

## 2016-05-03 HISTORY — DX: Anemia, unspecified: D64.9

## 2016-05-03 NOTE — Progress Notes (Signed)
Complaints of right breast lump x 2 weeks that has decreased in size since start of Keflex 2 days ago. Patient stated was painful at first but has resolved.  Pap Smear:  Pap smear not completed today. Last Pap smear was in June 2016 in [place] and normal per patient. Per patient has a history of an abnormal Pap smear in 2000 that a repeat Pap smear was completed for follow up that was normal per patient. No Pap smear results are in EPIC.  Physical exam: Breasts Breasts symmetrical. No skin abnormalities left breast. A reddened area observed at 3 o'clock on the edge of the areola. No nipple retraction bilateral breasts. No nipple discharge bilateral breasts. No lymphadenopathy. No lumps palpated left breast. Palpated a lump within the right breast at 3 o'clock at edge of areola where skin is reddened. No complaints of pain or tenderness on exam. [facility] due to patient being pregnant. Appointment scheduled for Thursday, May 03, 2016 at 0900.        Pelvic/Bimanual No Pap smear completed today since last Pap smear was in June 2016 per patient. Pap smear not indicated per B[facility]CP guidelines.   Smoking History: Patient has never smoked.  Patient Navigation: Patient education provided. [facility].

## 2016-05-03 NOTE — Patient Instructions (Signed)
Explained breast self awareness to [name]. Patient did not need a Pap smear today due to last Pap smear was in June 2016 per patient. Let her know [facility] will cover Pap smears every 3 years unless has a history of abnormal Pap smears. [facility] right [facility] due to patient being pregnant. Appointment scheduled for Thursday, May 03, 2016 at 0900.  Tanya Villegas verbalized understanding.  [name], Kathaleen Maserhristine Poll, RN 1:00 PM

## 2016-05-04 ENCOUNTER — Encounter (HOSPITAL_COMMUNITY): Payer: Self-pay | Admitting: *Deleted

## 2016-05-08 ENCOUNTER — Ambulatory Visit (INDEPENDENT_AMBULATORY_CARE_PROVIDER_SITE_OTHER): Payer: Self-pay | Admitting: Medical

## 2016-05-08 VITALS — BP 114/68 | HR 72 | Wt 165.2 lb

## 2016-05-08 DIAGNOSIS — Z3490 Encounter for supervision of normal pregnancy, unspecified, unspecified trimester: Secondary | ICD-10-CM

## 2016-05-08 DIAGNOSIS — Z3492 Encounter for supervision of normal pregnancy, unspecified, second trimester: Secondary | ICD-10-CM

## 2016-05-08 LAB — CBC
HEMATOCRIT: 30.5 % — AB (ref 35.0–45.0)
Hemoglobin: 10.2 g/dL — ABNORMAL LOW (ref 11.7–15.5)
MCH: 31 pg (ref 27.0–33.0)
MCHC: 33.4 g/dL (ref 32.0–36.0)
MCV: 92.7 fL (ref 80.0–100.0)
MPV: 11.4 fL (ref 7.5–12.5)
Platelets: 127 10*3/uL — ABNORMAL LOW (ref 140–400)
RBC: 3.29 MIL/uL — AB (ref 3.80–5.10)
RDW: 14 % (ref 11.0–15.0)
WBC: 8.2 10*3/uL (ref 3.8–10.8)

## 2016-05-08 NOTE — Patient Instructions (Signed)
Introduction Patient Name: ________________________________________________ Patient Due Date: ____________________ What is a fetal movement count? A fetal movement count is the number of times that you feel your baby move during a certain amount of time. This may also be called a fetal kick count. A fetal movement count is recommended for every pregnant woman. You may be asked to start counting fetal movements as early as week 28 of your pregnancy. Pay attention to when your baby is most active. You may notice your baby's sleep and wake cycles. You may also notice things that make your baby move more. You should do a fetal movement count:  When your baby is normally most active.  At the same time each day. A good time to count movements is while you are resting, after having something to eat and drink. How do I count fetal movements? 1. Find a quiet, comfortable area. Sit, or lie down on your side. 2. Write down the date, the start time and stop time, and the number of movements that you felt between those two times. Take this information with you to your health care visits. 3. For 2 hours, count kicks, flutters, swishes, rolls, and jabs. You should feel at least 10 movements during 2 hours. 4. You may stop counting after you have felt 10 movements. 5. If you do not feel 10 movements in 2 hours, have something to eat and drink. Then, keep resting and counting for 1 hour. If you feel at least 4 movements during that hour, you may stop counting. Contact a health care provider if:  You feel fewer than 4 movements in 2 hours.  Your baby is not moving like he or she usually does. Date: ____________ Start time: ____________ Stop time: ____________ Movements: ____________ Date: ____________ Start time: ____________ Stop time: ____________ Movements: ____________ Date: ____________ Start time: ____________ Stop time: ____________ Movements: ____________ Date: ____________ Start time: ____________  Stop time: ____________ Movements: ____________ Date: ____________ Start time: ____________ Stop time: ____________ Movements: ____________ Date: ____________ Start time: ____________ Stop time: ____________ Movements: ____________ Date: ____________ Start time: ____________ Stop time: ____________ Movements: ____________ Date: ____________ Start time: ____________ Stop time: ____________ Movements: ____________ Date: ____________ Start time: ____________ Stop time: ____________ Movements: ____________ This information is not intended to replace advice given to you by your health care provider. Make sure you discuss any questions you have with your health care provider. Document Released: 05/16/2006 Document Revised: 12/14/2015 Document Reviewed: 05/26/2015 Elsevier Interactive Patient Education  2017 Elsevier Inc.  

## 2016-05-08 NOTE — Addendum Note (Signed)
Addended by: Faythe CasaBELLAMY, Jenesys Casseus M on: 05/08/2016 09:32 AM   Modules accepted: Orders

## 2016-05-08 NOTE — Addendum Note (Signed)
Addended by: Faythe CasaBELLAMY, JEANETTA M on: 05/08/2016 10:23 AM   Modules accepted: Orders

## 2016-05-08 NOTE — Progress Notes (Signed)
28 wk labs today 28 wk packet given  

## 2016-05-08 NOTE — Addendum Note (Signed)
Addended by: Faythe CasaBELLAMY, Antara Brecheisen M on: 05/08/2016 10:16 AM   Modules accepted: Orders

## 2016-05-09 LAB — 2HR GTT W 1 HR, CARPENTER, 75 G
Glucose, 1 Hr, Gest: 140 mg/dL (ref <180)
Glucose, 2 Hr, Gest: 97 mg/dL (ref <153)
Glucose, Fasting, Gest: 87 mg/dL (ref 65–91)

## 2016-05-09 LAB — HIV ANTIBODY (ROUTINE TESTING W REFLEX): HIV 1&2 Ab, 4th Generation: NONREACTIVE

## 2016-05-09 LAB — CULTURE, OB URINE: Organism ID, Bacteria: NO GROWTH

## 2016-05-09 LAB — RPR

## 2016-05-15 LAB — HEMOGLOBINOPATHY EVALUATION
HEMATOCRIT: 31.7 % — AB (ref 35.0–45.0)
HEMOGLOBIN: 10.3 g/dL — AB (ref 11.7–15.5)
HGB A2 QUANT: 2.5 % (ref 1.8–3.5)
Hgb A: 96.5 % (ref >96.0)
Hgb F Quant: <1 % (ref <2.0)
MCH: 31.3 pg (ref 27.0–33.0)
MCV: 96.4 fL (ref 80.0–100.0)
RBC: 3.29 MIL/uL — ABNORMAL LOW (ref 3.80–5.10)
RDW: 13.9 % (ref 11.0–15.0)

## 2016-05-17 ENCOUNTER — Other Ambulatory Visit: Payer: Self-pay

## 2016-05-22 ENCOUNTER — Ambulatory Visit (INDEPENDENT_AMBULATORY_CARE_PROVIDER_SITE_OTHER): Payer: Self-pay | Admitting: Medical

## 2016-05-22 VITALS — BP 115/73 | HR 76 | Wt 163.5 lb

## 2016-05-22 DIAGNOSIS — Z3492 Encounter for supervision of normal pregnancy, unspecified, second trimester: Secondary | ICD-10-CM

## 2016-05-22 DIAGNOSIS — Z3493 Encounter for supervision of normal pregnancy, unspecified, third trimester: Secondary | ICD-10-CM

## 2016-05-22 MED ORDER — PRENATAL VITAMINS 0.8 MG PO TABS: 1.0000 | ORAL_TABLET | Freq: Every day | ORAL | 12 refills | Status: DC

## 2016-05-22 NOTE — Progress Notes (Signed)
   PRENATAL VISIT NOTE  Subjective:  Tanya Villegas is a 41 y.o. G6P5005 at 2426w6d being seen today for ongoing prenatal care.  She is currently monitored for the following issues for this high-risk pregnancy and has Substance abuse affecting pregnancy in third trimester, antepartum; Supervision of low-risk pregnancy, second trimester; Anemia in pregnancy; Gestational thrombocytopenia (HCC); and Breast abscess during pregnancy, antepartum on her problem list.  Patient reports no complaints.  Contractions: Irritability. Vag. Bleeding: None.  Movement: Present. Denies leaking of fluid.   The following portions of the patient's history were reviewed and updated as appropriate: allergies, current medications, past family history, past medical history, past social history, past surgical history and problem list. Problem list updated.  Objective:   Vitals:   05/22/16 0917  BP: 115/73  Pulse: 76  Weight: 163 lb 8 oz (74.2 kg)    Fetal Status: Fetal Heart Rate (bpm): 145 Fundal Height: 30 cm Movement: Present     General:  Alert, oriented and cooperative. Patient is in no acute distress.  Skin: Skin is warm and dry. No rash noted.   Cardiovascular: Normal heart rate noted  Respiratory: Normal respiratory effort, no problems with respiration noted  Abdomen: Soft, gravid, appropriate for gestational age. Pain/Pressure: Absent     Pelvic:  Cervical exam deferred        Extremities: Normal range of motion.  Edema: None  Mental Status: Normal mood and affect. Normal behavior. Normal judgment and thought content.   Assessment and Plan:  Pregnancy: G6P5005 at 4026w6d  1. Supervision of low-risk pregnancy, third trimester - Prenatal Multivit-Min-Fe-FA (PRENATAL VITAMINS) 0.8 MG tablet; Take 1 tablet by mouth daily.  Dispense: 30 tablet; Refill: 12  2. Supervision of low-risk pregnancy, second trimester - PLT 127 - stable from last check, will re-check at next visit - Patient declined TDap today -  Discussed BTL with patient and spouse again, will consider and decide by next visit. Also considering vasectomy for partner. Discussed LARC as well if unsure about surgery.   Preterm labor symptoms and general obstetric precautions including but not limited to vaginal bleeding, contractions, leaking of fluid and fetal movement were reviewed in detail with the patient. Please refer to After Visit Summary for other counseling recommendations.  Return in about 2 weeks (around 06/05/2016) for LOB.   Marny LowensteinJulie N Jaquise Faux, PA-C

## 2016-05-22 NOTE — Patient Instructions (Signed)
Braxton Hicks Contractions Contractions of the uterus can occur throughout pregnancy. Contractions are not always a sign that you are in labor.  WHAT ARE BRAXTON HICKS CONTRACTIONS?  Contractions that occur before labor are called Braxton Hicks contractions, or false labor. Toward the end of pregnancy (32-34 weeks), these contractions can develop more often and may become more forceful. This is not true labor because these contractions do not result in opening (dilatation) and thinning of the cervix. They are sometimes difficult to tell apart from true labor because these contractions can be forceful and people have different pain tolerances. You should not feel embarrassed if you go to the hospital with false labor. Sometimes, the only way to tell if you are in true labor is for your health care provider to look for changes in the cervix. If there are no prenatal problems or other health problems associated with the pregnancy, it is completely safe to be sent home with false labor and await the onset of true labor. HOW CAN YOU TELL THE DIFFERENCE BETWEEN TRUE AND FALSE LABOR? False Labor   The contractions of false labor are usually shorter and not as hard as those of true labor.   The contractions are usually irregular.   The contractions are often felt in the front of the lower abdomen and in the groin.   The contractions may go away when you walk around or change positions while lying down.   The contractions get weaker and are shorter lasting as time goes on.   The contractions do not usually become progressively stronger, regular, and closer together as with true labor.  True Labor   Contractions in true labor last 30-70 seconds, become very regular, usually become more intense, and increase in frequency.   The contractions do not go away with walking.   The discomfort is usually felt in the top of the uterus and spreads to the lower abdomen and low back.   True labor can be  determined by your health care provider with an exam. This will show that the cervix is dilating and getting thinner.  WHAT TO REMEMBER  Keep up with your usual exercises and follow other instructions given by your health care provider.   Take medicines as directed by your health care provider.   Keep your regular prenatal appointments.   Eat and drink lightly if you think you are going into labor.   If Braxton Hicks contractions are making you uncomfortable:   Change your position from lying down or resting to walking, or from walking to resting.   Sit and rest in a tub of warm water.   Drink 2-3 glasses of water. Dehydration may cause these contractions.   Do slow and deep breathing several times an hour.  WHEN SHOULD I SEEK IMMEDIATE MEDICAL CARE? Seek immediate medical care if:  Your contractions become stronger, more regular, and closer together.   You have fluid leaking or gushing from your vagina.   You have a fever.   You pass blood-tinged mucus.   You have vaginal bleeding.   You have continuous abdominal pain.   You have low back pain that you never had before.   You feel your baby's head pushing down and causing pelvic pressure.   Your baby is not moving as much as it used to.  This information is not intended to replace advice given to you by your health care provider. Make sure you discuss any questions you have with your health care   provider. Document Released: 04/16/2005 Document Revised: 08/08/2015 Document Reviewed: 01/26/2013 Elsevier Interactive Patient Education  2017 [employer] Introduction Patient Name: ________________________________________________ Patient Due Date: ____________________ What is a fetal movement count? A fetal movement count is the number of times that you feel your baby move during a certain amount of time. This may also be called a fetal kick count. A fetal movement count is recommended for every pregnant  woman. You may be asked to start counting fetal movements as early as week 28 of your pregnancy. Pay attention to when your baby is most active. You may notice your baby's sleep and wake cycles. You may also notice things that make your baby move more. You should do a fetal movement count:  When your baby is normally most active.  At the same time each day. A good time to count movements is while you are resting, after having something to eat and drink. How do I count fetal movements? 1. Find a quiet, comfortable area. Sit, or lie down on your side. 2. Write down the date, the start time and stop time, and the number of movements that you felt between those two times. Take this information with you to your health care visits. 3. For 2 hours, count kicks, flutters, swishes, rolls, and jabs. You should feel at least 10 movements during 2 hours. 4. You may stop counting after you have felt 10 movements. 5. If you do not feel 10 movements in 2 hours, have something to eat and drink. Then, keep resting and counting for 1 hour. If you feel at least 4 movements during that hour, you may stop counting. Contact a health care provider if:  You feel fewer than 4 movements in 2 hours.  Your baby is not moving like he or she usually does. Date: ____________ Start time: ____________ Stop time: ____________ Movements: ____________ Date: ____________ Start time: ____________ Stop time: ____________ Movements: ____________ Date: ____________ Start time: ____________ Stop time: ____________ Movements: ____________ Date: ____________ Start time: ____________ Stop time: ____________ Movements: ____________ Date: ____________ Start time: ____________ Stop time: ____________ Movements: ____________ Date: ____________ Start time: ____________ Stop time: ____________ Movements: ____________ Date: ____________ Start time: ____________ Stop time: ____________ Movements: ____________ Date: ____________ Start time:  ____________ Stop time: ____________ Movements: ____________ Date: ____________ Start time: ____________ Stop time: ____________ Movements: ____________ This information is not intended to replace advice given to you by your health care provider. Make sure you discuss any questions you have with your health care provider. Document Released: 05/16/2006 Document Revised: 12/14/2015 Document Reviewed: 05/26/2015 Elsevier Interactive Patient Education  2017 [employer]  

## 2016-06-07 ENCOUNTER — Ambulatory Visit (INDEPENDENT_AMBULATORY_CARE_PROVIDER_SITE_OTHER): Payer: Self-pay | Admitting: Medical

## 2016-06-07 VITALS — BP 118/72 | HR 72 | Wt 163.3 lb

## 2016-06-07 DIAGNOSIS — O99119 Other diseases of the blood and blood-forming organs and certain disorders involving the immune mechanism complicating pregnancy, unspecified trimester: Principal | ICD-10-CM

## 2016-06-07 DIAGNOSIS — D696 Thrombocytopenia, unspecified: Secondary | ICD-10-CM

## 2016-06-07 DIAGNOSIS — Z3493 Encounter for supervision of normal pregnancy, unspecified, third trimester: Secondary | ICD-10-CM

## 2016-06-07 DIAGNOSIS — O99112 Other diseases of the blood and blood-forming organs and certain disorders involving the immune mechanism complicating pregnancy, second trimester: Secondary | ICD-10-CM

## 2016-06-07 DIAGNOSIS — Z3492 Encounter for supervision of normal pregnancy, unspecified, second trimester: Secondary | ICD-10-CM

## 2016-06-07 LAB — CBC
HCT: 31.3 % — ABNORMAL LOW (ref 35.0–45.0)
HEMOGLOBIN: 10.4 g/dL — AB (ref 11.7–15.5)
MCH: 30.8 pg (ref 27.0–33.0)
MCHC: 33.2 g/dL (ref 32.0–36.0)
MCV: 92.6 fL (ref 80.0–100.0)
MPV: 12.4 fL (ref 7.5–12.5)
Platelets: 112 10*3/uL — ABNORMAL LOW (ref 140–400)
RBC: 3.38 MIL/uL — AB (ref 3.80–5.10)
RDW: 13.6 % (ref 11.0–15.0)
WBC: 6.1 10*3/uL (ref 3.8–10.8)

## 2016-06-07 NOTE — Progress Notes (Signed)
   PRENATAL VISIT NOTE  Subjective:  Tanya Villegas is a 41 y.o. G6P5005 at 7197w1d being seen today for ongoing prenatal care.  She is currently monitored for the following issues for this high-risk pregnancy and has Substance abuse affecting pregnancy in third trimester, antepartum; Supervision of low-risk pregnancy, second trimester; Anemia in pregnancy; Gestational thrombocytopenia (HCC); and Breast abscess during pregnancy, antepartum on her problem list.  Patient reports no complaints.  Contractions: Not present. Vag. Bleeding: None.  Movement: Present. Denies leaking of fluid.   The following portions of the patient's history were reviewed and updated as appropriate: allergies, current medications, past family history, past medical history, past social history, past surgical history and problem list. Problem list updated.  Objective:   Vitals:   06/07/16 1021  BP: 118/72  Pulse: 72  Weight: 163 lb 4.8 oz (74.1 kg)    Fetal Status: Fetal Heart Rate (bpm): 144 Fundal Height: 33 cm Movement: Present     General:  Alert, oriented and cooperative. Patient is in no acute distress.  Skin: Skin is warm and dry. No rash noted.   Cardiovascular: Normal heart rate noted  Respiratory: Normal respiratory effort, no problems with respiration noted  Abdomen: Soft, gravid, appropriate for gestational age. Pain/Pressure: Absent     Pelvic:  Cervical exam deferred        Extremities: Normal range of motion.  Edema: Trace  Mental Status: Normal mood and affect. Normal behavior. Normal judgment and thought content.   Assessment and Plan:  Pregnancy: G6P5005 at 5997w1d  1. Thrombocytopenia affecting pregnancy, antepartum (HCC) - CBC, repeat today   2. Supervision of low-risk pregnancy, third trimester - No complaints, doing well - Discussed birth control again, plan is for spouse to have vasectomy, patient declines BTL - Discussed GBS, GC/Chlamydia testing at next visit  Preterm labor symptoms  and general obstetric precautions including but not limited to vaginal bleeding, contractions, leaking of fluid and fetal movement were reviewed in detail with the patient. Please refer to After Visit Summary for other counseling recommendations.  Return in about 2 weeks (around 06/21/2016) for LOB.    Marny LowensteinJulie N Wenzel, PA-C

## 2016-06-07 NOTE — Patient Instructions (Signed)
Introduction Patient Name: ________________________________________________ Patient Due Date: ____________________ What is a fetal movement count? A fetal movement count is the number of times that you feel your baby move during a certain amount of time. This may also be called a fetal kick count. A fetal movement count is recommended for every pregnant woman. You may be asked to start counting fetal movements as early as week 28 of your pregnancy. Pay attention to when your baby is most active. You may notice your baby's sleep and wake cycles. You may also notice things that make your baby move more. You should do a fetal movement count:  When your baby is normally most active.  At the same time each day. A good time to count movements is while you are resting, after having something to eat and drink. How do I count fetal movements? 1. Find a quiet, comfortable area. Sit, or lie down on your side. 2. Write down the date, the start time and stop time, and the number of movements that you felt between those two times. Take this information with you to your health care visits. 3. For 2 hours, count kicks, flutters, swishes, rolls, and jabs. You should feel at least 10 movements during 2 hours. 4. You may stop counting after you have felt 10 movements. 5. If you do not feel 10 movements in 2 hours, have something to eat and drink. Then, keep resting and counting for 1 hour. If you feel at least 4 movements during that hour, you may stop counting. Contact a health care provider if:  You feel fewer than 4 movements in 2 hours.  Your baby is not moving like he or she usually does. Date: ____________ Start time: ____________ Stop time: ____________ Movements: ____________ Date: ____________ Start time: ____________ Stop time: ____________ Movements: ____________ Date: ____________ Start time: ____________ Stop time: ____________ Movements: ____________ Date: ____________ Start time: ____________  Stop time: ____________ Movements: ____________ Date: ____________ Start time: ____________ Stop time: ____________ Movements: ____________ Date: ____________ Start time: ____________ Stop time: ____________ Movements: ____________ Date: ____________ Start time: ____________ Stop time: ____________ Movements: ____________ Date: ____________ Start time: ____________ Stop time: ____________ Movements: ____________ Date: ____________ Start time: ____________ Stop time: ____________ Movements: ____________ This information is not intended to replace advice given to you by your health care provider. Make sure you discuss any questions you have with your health care provider. Document Released: 05/16/2006 Document Revised: 12/14/2015 Document Reviewed: 05/26/2015 Elsevier Interactive Patient Education  2017 [employer] Braxton Hicks Contractions Contractions of the uterus can occur throughout pregnancy. Contractions are not always a sign that you are in labor.  WHAT ARE BRAXTON HICKS CONTRACTIONS?  Contractions that occur before labor are called Braxton Hicks contractions, or false labor. Toward the end of pregnancy (32-34 weeks), these contractions can develop more often and may become more forceful. This is not true labor because these contractions do not result in opening (dilatation) and thinning of the cervix. They are sometimes difficult to tell apart from true labor because these contractions can be forceful and people have different pain tolerances. You should not feel embarrassed if you go to the hospital with false labor. Sometimes, the only way to tell if you are in true labor is for your health care provider to look for changes in the cervix. If there are no prenatal problems or other health problems associated with the pregnancy, it is completely safe to be sent home with false labor and await the onset of true labor.   HOW CAN YOU TELL THE DIFFERENCE BETWEEN TRUE AND FALSE LABOR? False Labor     The contractions of false labor are usually shorter and not as hard as those of true labor.   The contractions are usually irregular.   The contractions are often felt in the front of the lower abdomen and in the groin.   The contractions may go away when you walk around or change positions while lying down.   The contractions get weaker and are shorter lasting as time goes on.   The contractions do not usually become progressively stronger, regular, and closer together as with true labor.  True Labor   Contractions in true labor last 30-70 seconds, become very regular, usually become more intense, and increase in frequency.   The contractions do not go away with walking.   The discomfort is usually felt in the top of the uterus and spreads to the lower abdomen and low back.   True labor can be determined by your health care provider with an exam. This will show that the cervix is dilating and getting thinner.  WHAT TO REMEMBER  Keep up with your usual exercises and follow other instructions given by your health care provider.   Take medicines as directed by your health care provider.   Keep your regular prenatal appointments.   Eat and drink lightly if you think you are going into labor.   If Braxton Hicks contractions are making you uncomfortable:   Change your position from lying down or resting to walking, or from walking to resting.   Sit and rest in a tub of warm water.   Drink 2-3 glasses of water. Dehydration may cause these contractions.   Do slow and deep breathing several times an hour.  WHEN SHOULD I SEEK IMMEDIATE MEDICAL CARE? Seek immediate medical care if:  Your contractions become stronger, more regular, and closer together.   You have fluid leaking or gushing from your vagina.   You have a fever.   You pass blood-tinged mucus.   You have vaginal bleeding.   You have continuous abdominal pain.   You have low back pain  that you never had before.   You feel your baby's head pushing down and causing pelvic pressure.   Your baby is not moving as much as it used to.  This information is not intended to replace advice given to you by your health care provider. Make sure you discuss any questions you have with your health care provider. Document Released: 04/16/2005 Document Revised: 08/08/2015 Document Reviewed: 01/26/2013 Elsevier Interactive Patient Education  2017 [employer]  

## 2016-06-07 NOTE — Progress Notes (Signed)
Declined flu

## 2016-06-21 ENCOUNTER — Ambulatory Visit (INDEPENDENT_AMBULATORY_CARE_PROVIDER_SITE_OTHER): Payer: Self-pay | Admitting: Advanced Practice Midwife

## 2016-06-21 VITALS — BP 123/77 | HR 74 | Wt 164.1 lb

## 2016-06-21 DIAGNOSIS — D696 Thrombocytopenia, unspecified: Secondary | ICD-10-CM

## 2016-06-21 DIAGNOSIS — O99119 Other diseases of the blood and blood-forming organs and certain disorders involving the immune mechanism complicating pregnancy, unspecified trimester: Principal | ICD-10-CM

## 2016-06-21 DIAGNOSIS — O09523 Supervision of elderly multigravida, third trimester: Secondary | ICD-10-CM | POA: Insufficient documentation

## 2016-06-21 DIAGNOSIS — O99113 Other diseases of the blood and blood-forming organs and certain disorders involving the immune mechanism complicating pregnancy, third trimester: Secondary | ICD-10-CM

## 2016-06-21 DIAGNOSIS — O1203 Gestational edema, third trimester: Secondary | ICD-10-CM

## 2016-06-21 DIAGNOSIS — Z3493 Encounter for supervision of normal pregnancy, unspecified, third trimester: Secondary | ICD-10-CM

## 2016-06-21 NOTE — Progress Notes (Signed)
   PRENATAL VISIT NOTE  Subjective:  Tanya Villegas is a 41 y.o. G6P5005 at 5864w1d being seen today for ongoing prenatal care.  She is currently monitored for the following issues for this low-risk pregnancy and has Substance abuse affecting pregnancy in third trimester, antepartum; Supervision of low-risk pregnancy, second trimester; Anemia in pregnancy; Gestational thrombocytopenia (HCC); and Breast abscess during pregnancy, antepartum on her problem list.  Patient reports edema of BLE, more on right that is intermittent, mostly at work. She sits 8 hours/day for her job..  Contractions: Irritability. Vag. Bleeding: None.  Movement: Present. Denies leaking of fluid.   The following portions of the patient's history were reviewed and updated as appropriate: allergies, current medications, past family history, past medical history, past social history, past surgical history and problem list. Problem list updated.  Objective:   Vitals:   06/21/16 0958  BP: 123/77  Pulse: 74  Weight: 164 lb 1.6 oz (74.4 kg)    Fetal Status: Fetal Heart Rate (bpm): 143 Fundal Height: 35 cm Movement: Present     General:  Alert, oriented and cooperative. Patient is in no acute distress.  Skin: Skin is warm and dry. No rash noted.   Cardiovascular: Normal heart rate noted  Respiratory: Normal respiratory effort, no problems with respiration noted  Abdomen: Soft, gravid, appropriate for gestational age. Pain/Pressure: Absent     Pelvic:  Cervical exam deferred        Extremities: Normal range of motion.  Edema: None  Mental Status: Normal mood and affect. Normal behavior. Normal judgment and thought content.   Assessment and Plan:  Pregnancy: G6P5005 at 6764w1d  1. Thrombocytopenia affecting pregnancy, antepartum (HCC) --Likely benign gestational thrombocytopenia.  Plts 113 on 06/07/16  2. Supervision of low-risk pregnancy, third trimester   3. Edema during pregnancy in third trimester --Elevate feet at  work, more breaks to walk around/less sitting, increase water intake, wear support socks/stockings at work. --Warning signs of DVT/PE reviewed/reasons to come to hospital.  Preterm labor symptoms and general obstetric precautions including but not limited to vaginal bleeding, contractions, leaking of fluid and fetal movement were reviewed in detail with the patient. Please refer to After Visit Summary for other counseling recommendations.  Return in about 2 weeks (around 07/05/2016).   Hurshel PartyLisa A Leftwich-Kirby, CNM

## 2016-06-21 NOTE — Patient Instructions (Signed)
Third Trimester of Pregnancy The third trimester is from week 29 through week 40 (months 7 through 9). The third trimester is a time when the unborn baby (fetus) is growing rapidly. At the end of the ninth month, the fetus is about 20 inches in length and weighs 6-10 pounds. Body changes during your third trimester Your body goes through many changes during pregnancy. The changes vary from woman to woman. During the third trimester:  Your weight will continue to increase. You can expect to gain 25-35 pounds (11-16 kg) by the end of the pregnancy.  You may begin to get stretch marks on your hips, abdomen, and breasts.  You may urinate more often because the fetus is moving lower into your pelvis and pressing on your bladder.  You may develop or continue to have heartburn. This is caused by increased hormones that slow down muscles in the digestive tract.  You may develop or continue to have constipation because increased hormones slow digestion and cause the muscles that push waste through your intestines to relax.  You may develop hemorrhoids. These are swollen veins (varicose veins) in the rectum that can itch or be painful.  You may develop swollen, bulging veins (varicose veins) in your legs.  You may have increased body aches in the pelvis, back, or thighs. This is due to weight gain and increased hormones that are relaxing your joints.  You may have changes in your hair. These can include thickening of your hair, rapid growth, and changes in texture. Some women also have hair loss during or after pregnancy, or hair that feels dry or thin. Your hair will most likely return to normal after your baby is born.  Your breasts will continue to grow and they will continue to become tender. A yellow fluid (colostrum) may leak from your breasts. This is the first milk you are producing for your baby.  Your belly button may stick out.  You may notice more swelling in your hands, face, or  ankles.  You may have increased tingling or numbness in your hands, arms, and legs. The skin on your belly may also feel numb.  You may feel short of breath because of your expanding uterus.  You may have more problems sleeping. This can be caused by the size of your belly, increased need to urinate, and an increase in your body's metabolism.  You may notice the fetus "dropping," or moving lower in your abdomen.  You may have increased vaginal discharge.  Your cervix becomes thin and soft (effaced) near your due date. What to expect at prenatal visits You will have prenatal exams every 2 weeks until week 36. Then you will have weekly prenatal exams. During a routine prenatal visit:  You will be weighed to make sure you and the fetus are growing normally.  Your blood pressure will be taken.  Your abdomen will be measured to track your baby's growth.  The fetal heartbeat will be listened to.  Any test results from the previous visit will be discussed.  You may have a cervical check near your due date to see if you have effaced. At around 36 weeks, your health care provider will check your cervix. At the same time, your health care provider will also perform a test on the secretions of the vaginal tissue. This test is to determine if a type of bacteria, Group B streptococcus, is present. Your health care provider will explain this further. Your health care provider may ask you:    What your birth plan is.  How you are feeling.  If you are feeling the baby move.  If you have had any abnormal symptoms, such as leaking fluid, bleeding, severe headaches, or abdominal cramping.  If you are using any tobacco products, including cigarettes, chewing tobacco, and electronic cigarettes.  If you have any questions. Other tests or screenings that may be performed during your third trimester include:  Blood tests that check for low iron levels (anemia).  Fetal testing to check the health,  activity level, and growth of the fetus. Testing is done if you have certain medical conditions or if there are problems during the pregnancy.  Nonstress test (NST). This test checks the health of your baby to make sure there are no signs of problems, such as the baby not getting enough oxygen. During this test, a belt is placed around your belly. The baby is made to move, and its heart rate is monitored during movement. What is false labor? False labor is a condition in which you feel small, irregular tightenings of the muscles in the womb (contractions) that eventually go away. These are called Braxton Hicks contractions. Contractions may last for hours, days, or even weeks before true labor sets in. If contractions come at regular intervals, become more frequent, increase in intensity, or become painful, you should see your health care provider. What are the signs of labor?  Abdominal cramps.  Regular contractions that start at 10 minutes apart and become stronger and more frequent with time.  Contractions that start on the top of the uterus and spread down to the lower abdomen and back.  Increased pelvic pressure and dull back pain.  A watery or bloody mucus discharge that comes from the vagina.  Leaking of amniotic fluid. This is also known as your "water breaking." It could be a slow trickle or a gush. Let your doctor know if it has a color or strange odor. If you have any of these signs, call your health care provider right away, even if it is before your due date. Follow these instructions at home: Eating and drinking  Continue to eat regular, healthy meals.  Do not eat:  Raw meat or meat spreads.  Unpasteurized milk or cheese.  Unpasteurized juice.  Store-made salad.  Refrigerated smoked seafood.  Hot dogs or deli meat, unless they are piping hot.  More than 6 ounces of albacore tuna a week.  Shark, swordfish, king mackerel, or tile fish.  Store-made salads.  Raw  sprouts, such as mung bean or alfalfa sprouts.  Take prenatal vitamins as told by your health care provider.  Take 1000 mg of calcium daily as told by your health care provider.  If you develop constipation:  Take over-the-counter or prescription medicines.  Drink enough fluid to keep your urine clear or pale yellow.  Eat foods that are high in fiber, such as fresh fruits and vegetables, whole grains, and beans.  Limit foods that are high in fat and processed sugars, such as fried and sweet foods. Activity  Exercise only as directed by your health care provider. Healthy pregnant women should aim for 2 hours and 30 minutes of moderate exercise per week. If you experience any pain or discomfort while exercising, stop.  Avoid heavy lifting.  Do not exercise in extreme heat or humidity, or at high altitudes.  Wear low-heel, comfortable shoes.  Practice good posture.  Do not travel far distances unless it is absolutely necessary and only with the approval   of your health care provider.  Wear your seat belt at all times while in a car, on a bus, or on a plane.  Take frequent breaks and rest with your legs elevated if you have leg cramps or low back pain.  Do not use hot tubs, steam rooms, or saunas.  You may continue to have sex unless your health care provider tells you otherwise. Lifestyle  Do not use any products that contain nicotine or tobacco, such as cigarettes and e-cigarettes. If you need help quitting, ask your health care provider.  Do not drink alcohol.  Do not use any medicinal herbs or unprescribed drugs. These chemicals affect the formation and growth of the baby.  If you develop varicose veins:  Wear support pantyhose or compression stockings as told by your healthcare provider.  Elevate your feet for 15 minutes, 3-4 times a day.  Wear a supportive maternity bra to help with breast tenderness. General instructions  Take over-the-counter and prescription  medicines only as told by your health care provider. There are medicines that are either safe or unsafe to take during pregnancy.  Take warm sitz baths to soothe any pain or discomfort caused by hemorrhoids. Use hemorrhoid cream or witch hazel if your health care provider approves.  Avoid cat litter boxes and soil used by cats. These carry germs that can cause birth defects in the baby. If you have a cat, ask someone to clean the litter box for you.  To prepare for the arrival of your baby:  Take prenatal classes to understand, practice, and ask questions about the labor and delivery.  Make a trial run to the hospital.  Visit the hospital and tour the maternity area.  Arrange for maternity or paternity leave through employers.  Arrange for family and friends to take care of pets while you are in the hospital.  Purchase a rear-facing car seat and make sure you know how to install it in your car.  Pack your hospital bag.  Prepare the baby's nursery. Make sure to remove all pillows and stuffed animals from the baby's crib to prevent suffocation.  Visit your dentist if you have not gone during your pregnancy. Use a soft toothbrush to brush your teeth and be gentle when you floss.  Keep all prenatal follow-up visits as told by your health care provider. This is important. Contact a health care provider if:  You are unsure if you are in labor or if your water has broken.  You become dizzy.  You have mild pelvic cramps, pelvic pressure, or nagging pain in your abdominal area.  You have lower back pain.  You have persistent nausea, vomiting, or diarrhea.  You have an unusual or bad smelling vaginal discharge.  You have pain when you urinate. Get help right away if:  You have a fever.  You are leaking fluid from your vagina.  You have spotting or bleeding from your vagina.  You have severe abdominal pain or cramping.  You have rapid weight loss or weight gain.  You have  shortness of breath with chest pain.  You notice sudden or extreme swelling of your face, hands, ankles, feet, or legs.  Your baby makes fewer than 10 movements in 2 hours.  You have severe headaches that do not go away with medicine.  You have vision changes. Summary  The third trimester is from week 29 through week 40, months 7 through 9. The third trimester is a time when the unborn baby (fetus)   is growing rapidly.  During the third trimester, your discomfort may increase as you and your baby continue to gain weight. You may have abdominal, leg, and back pain, sleeping problems, and an increased need to urinate.  During the third trimester your breasts will keep growing and they will continue to become tender. A yellow fluid (colostrum) may leak from your breasts. This is the first milk you are producing for your baby.  False labor is a condition in which you feel small, irregular tightenings of the muscles in the womb (contractions) that eventually go away. These are called Braxton Hicks contractions. Contractions may last for hours, days, or even weeks before true labor sets in.  Signs of labor can include: abdominal cramps; regular contractions that start at 10 minutes apart and become stronger and more frequent with time; watery or bloody mucus discharge that comes from the vagina; increased pelvic pressure and dull back pain; and leaking of amniotic fluid. This information is not intended to replace advice given to you by your health care provider. Make sure you discuss any questions you have with your health care provider. Document Released: 04/10/2001 Document Revised: 09/22/2015 Document Reviewed: 06/17/2012 Elsevier Interactive Patient Education  2017 [employer] Inc[employer]  

## 2016-07-04 ENCOUNTER — Encounter: Payer: Self-pay | Admitting: Obstetrics and Gynecology

## 2016-07-05 ENCOUNTER — Ambulatory Visit (INDEPENDENT_AMBULATORY_CARE_PROVIDER_SITE_OTHER): Payer: Self-pay | Admitting: Medical

## 2016-07-05 VITALS — BP 130/80 | HR 86 | Wt 168.2 lb

## 2016-07-05 DIAGNOSIS — Z3493 Encounter for supervision of normal pregnancy, unspecified, third trimester: Secondary | ICD-10-CM

## 2016-07-05 DIAGNOSIS — Z113 Encounter for screening for infections with a predominantly sexual mode of transmission: Secondary | ICD-10-CM

## 2016-07-05 DIAGNOSIS — Z3483 Encounter for supervision of other normal pregnancy, third trimester: Secondary | ICD-10-CM

## 2016-07-05 LAB — OB RESULTS CONSOLE GBS: GBS: POSITIVE

## 2016-07-05 LAB — OB RESULTS CONSOLE GC/CHLAMYDIA: GC PROBE AMP, GENITAL: NEGATIVE

## 2016-07-05 NOTE — Progress Notes (Signed)
   PRENATAL VISIT NOTE  Subjective:  Tanya Villegas is a 41 y.o. y.o. G6P5005 at 1287w1d being seen today for ongoing prenatal care.  She is currently monitored for the following issues for this low-risk pregnancy and has Substance abuse affecting pregnancy in third trimester, antepartum; Supervision of low-risk pregnancy, second trimester; Anemia in pregnancy; Gestational thrombocytopenia (HCC); Breast abscess during pregnancy, antepartum; and Advanced maternal age in multigravida, third trimester on her problem list.  Patient reports backache and LLQ abdominal pain.  Abdominal pain is sharp and intermittent with change of positions.  Contractions: Not present. Vag. Bleeding: None.  Movement: Present. Denies leaking of fluid.   The following portions of the patient's history were reviewed and updated as appropriate: allergies, current medications, past family history, past medical history, past social history, past surgical history and problem list. Problem list updated.  Objective:   Vitals:   07/05/16 0802  BP: 130/80  Pulse: 86  Weight: 168 lb 3.2 oz (76.3 kg)    Fetal Status: Fetal Heart Rate (bpm): 150 Fundal Height: 36 cm Movement: Present  Presentation: Vertex  General:  Alert, oriented and cooperative. Patient is in no acute distress.  Skin: Skin is warm and dry. No rash noted.   Cardiovascular: Normal heart rate noted  Respiratory: Normal respiratory effort, no problems with respiration noted  Abdomen: Soft, gravid, appropriate for gestational age. Pain/Pressure: Absent     Pelvic:  Cervical exam performed Dilation: 1.5 Effacement (%): 20 Station: -3  Extremities: Normal range of motion.  Edema: None  Mental Status: Normal mood and affect. Normal behavior. Normal judgment and thought content.  Breast: symmetric, small mobile mass at 3:00 at the edge of the right areola. No erythema. No drainage. Minimal tenderness to palpation.   Assessment and Plan:  Pregnancy: G6P5005 at  [redacted]w[redacted]d  1. Supervision of low-risk pregnancy, third trimester - Culture, beta strep (group b only) - GC[facility]Chlamydia probe amp ([facility] - Tylenol recommended for back and abdominal pain - Discussed use of abdominal binder for pain as well - Heat to the back is appropriate - Patient concerned about breast abscess, no evidence of abscess today, possibly just a milk duct, advised to watch for signs of infection  Term labor symptoms and general obstetric precautions including but not limited to vaginal bleeding, contractions, leaking of fluid and fetal movement were reviewed in detail with the patient. Please refer to After Visit Summary for other counseling recommendations.  Return in about 1 week (around 07/12/2016) for LOB.   Marny LowensteinJulie N Dorean Hiebert, PA-C

## 2016-07-05 NOTE — Patient Instructions (Signed)
Fetal Movement Counts Patient Name: ________________________________________________ Patient Due Date: ____________________ What is a fetal movement count? A fetal movement count is the number of times that you feel your baby move during a certain amount of time. This may also be called a fetal kick count. A fetal movement count is recommended for every pregnant woman. You may be asked to start counting fetal movements as early as week 28 of your pregnancy. Pay attention to when your baby is most active. You may notice your baby's sleep and wake cycles. You may also notice things that make your baby move more. You should do a fetal movement count:  When your baby is normally most active.  At the same time each day. A good time to count movements is while you are resting, after having something to eat and drink. How do I count fetal movements? 1. Find a quiet, comfortable area. Sit, or lie down on your side. 2. Write down the date, the start time and stop time, and the number of movements that you felt between those two times. Take this information with you to your health care visits. 3. For 2 hours, count kicks, flutters, swishes, rolls, and jabs. You should feel at least 10 movements during 2 hours. 4. You may stop counting after you have felt 10 movements. 5. If you do not feel 10 movements in 2 hours, have something to eat and drink. Then, keep resting and counting for 1 hour. If you feel at least 4 movements during that hour, you may stop counting. Contact a health care provider if:  You feel fewer than 4 movements in 2 hours.  Your baby is not moving like he or she usually does. Date: ____________ Start time: ____________ Stop time: ____________ Movements: ____________ Date: ____________ Start time: ____________ Stop time: ____________ Movements: ____________ Date: ____________ Start time: ____________ Stop time: ____________ Movements: ____________ Date: ____________ Start time:  ____________ Stop time: ____________ Movements: ____________ Date: ____________ Start time: ____________ Stop time: ____________ Movements: ____________ Date: ____________ Start time: ____________ Stop time: ____________ Movements: ____________ Date: ____________ Start time: ____________ Stop time: ____________ Movements: ____________ Date: ____________ Start time: ____________ Stop time: ____________ Movements: ____________ Date: ____________ Start time: ____________ Stop time: ____________ Movements: ____________ This information is not intended to replace advice given to you by your health care provider. Make sure you discuss any questions you have with your health care provider. Document Released: 05/16/2006 Document Revised: 12/14/2015 Document Reviewed: 05/26/2015 Elsevier Interactive Patient Education  2017 [employer] Braxton Hicks Contractions Contractions of the uterus can occur throughout pregnancy, but they are not always a sign that you are in labor. You may have practice contractions called Braxton Hicks contractions. These false labor contractions are sometimes confused with true labor. What are Braxton Hicks contractions? Braxton Hicks contractions are tightening movements that occur in the muscles of the uterus before labor. Unlike true labor contractions, these contractions do not result in opening (dilation) and thinning of the cervix. Toward the end of pregnancy (32-34 weeks), Braxton Hicks contractions can happen more often and may become stronger. These contractions are sometimes difficult to tell apart from true labor because they can be very uncomfortable. You should not feel embarrassed if you go to the hospital with false labor. Sometimes, the only way to tell if you are in true labor is for your health care provider to look for changes in the cervix. The health care provider will do a physical exam and may monitor your contractions. If you   are not in true labor, the exam  should show that your cervix is not dilating and your water has not broken. If there are no prenatal problems or other health problems associated with your pregnancy, it is completely safe for you to be sent home with false labor. You may continue to have Braxton Hicks contractions until you go into true labor. How can I tell the difference between true labor and false labor?  Differences  False labor  Contractions last 30-70 seconds.: Contractions are usually shorter and not as strong as true labor contractions.  Contractions become very regular.: Contractions are usually irregular.  Discomfort is usually felt in the top of the uterus, and it spreads to the lower abdomen and low back.: Contractions are often felt in the front of the lower abdomen and in the groin.  Contractions do not go away with walking.: Contractions may go away when you walk around or change positions while lying down.  Contractions usually become more intense and increase in frequency.: Contractions get weaker and are shorter-lasting as time goes on.  The cervix dilates and gets thinner.: The cervix usually does not dilate or become thin. Follow these instructions at home:  Take over-the-counter and prescription medicines only as told by your health care provider.  Keep up with your usual exercises and follow other instructions from your health care provider.  Eat and drink lightly if you think you are going into labor.  If Braxton Hicks contractions are making you uncomfortable:  Change your position from lying down or resting to walking, or change from walking to resting.  Sit and rest in a tub of warm water.  Drink enough fluid to keep your urine clear or pale yellow. Dehydration may cause these contractions.  Do slow and deep breathing several times an hour.  Keep all follow-up prenatal visits as told by your health care provider. This is important. Contact a health care provider if:  You have a  fever.  You have continuous pain in your abdomen. Get help right away if:  Your contractions become stronger, more regular, and closer together.  You have fluid leaking or gushing from your vagina.  You pass blood-tinged mucus (bloody show).  You have bleeding from your vagina.  You have low back pain that you never had before.  You feel your baby's head pushing down and causing pelvic pressure.  Your baby is not moving inside you as much as it used to. Summary  Contractions that occur before labor are called Braxton Hicks contractions, false labor, or practice contractions.  Braxton Hicks contractions are usually shorter, weaker, farther apart, and less regular than true labor contractions. True labor contractions usually become progressively stronger and regular and they become more frequent.  Manage discomfort from Braxton Hicks contractions by changing position, resting in a warm bath, drinking plenty of water, or practicing deep breathing. This information is not intended to replace advice given to you by your health care provider. Make sure you discuss any questions you have with your health care provider. Document Released: 04/16/2005 Document Revised: 03/05/2016 Document Reviewed: 03/05/2016 Elsevier Interactive Patient Education  2017 [employer] Inc[employer]  

## 2016-07-06 LAB — GC/CHLAMYDIA PROBE AMP (~~LOC~~) NOT AT ARMC
CHLAMYDIA, DNA PROBE: NEGATIVE
NEISSERIA GONORRHEA: NEGATIVE

## 2016-07-08 LAB — CULTURE, BETA STREP (GROUP B ONLY): STREP GP B CULTURE: POSITIVE — AB

## 2016-07-09 ENCOUNTER — Encounter: Payer: Self-pay | Admitting: Medical

## 2016-07-09 DIAGNOSIS — B951 Streptococcus, group B, as the cause of diseases classified elsewhere: Secondary | ICD-10-CM | POA: Insufficient documentation

## 2016-07-12 ENCOUNTER — Ambulatory Visit (INDEPENDENT_AMBULATORY_CARE_PROVIDER_SITE_OTHER): Payer: Self-pay | Admitting: Medical

## 2016-07-12 VITALS — BP 121/79 | HR 67 | Wt 165.2 lb

## 2016-07-12 DIAGNOSIS — D696 Thrombocytopenia, unspecified: Secondary | ICD-10-CM

## 2016-07-12 DIAGNOSIS — O99113 Other diseases of the blood and blood-forming organs and certain disorders involving the immune mechanism complicating pregnancy, third trimester: Secondary | ICD-10-CM

## 2016-07-12 DIAGNOSIS — O99119 Other diseases of the blood and blood-forming organs and certain disorders involving the immune mechanism complicating pregnancy, unspecified trimester: Secondary | ICD-10-CM

## 2016-07-12 DIAGNOSIS — Z3493 Encounter for supervision of normal pregnancy, unspecified, third trimester: Secondary | ICD-10-CM

## 2016-07-12 MED ORDER — FERROUS SULFATE 325 (65 FE) MG PO TABS: 325.0000 mg | ORAL_TABLET | Freq: Every day | ORAL | 3 refills | Status: AC

## 2016-07-12 MED ORDER — PRENATAL VITAMINS 0.8 MG PO TABS: 1.0000 | ORAL_TABLET | Freq: Every day | ORAL | 12 refills | Status: AC

## 2016-07-12 NOTE — Patient Instructions (Signed)

## 2016-07-12 NOTE — Progress Notes (Signed)
   PRENATAL VISIT NOTE  Subjective:  Tanya Villegas is a 41 y.o. G6P5005 at 6983w1d being seen today for ongoing prenatal care.  She is currently monitored for the following issues for this low-risk pregnancy and has Substance abuse affecting pregnancy in third trimester, antepartum; Supervision of low-risk pregnancy, second trimester; Anemia in pregnancy; Gestational thrombocytopenia (HCC); Breast abscess during pregnancy, antepartum; Advanced maternal age in multigravida, third trimester; and Group beta Strep positive on her problem list.  Patient reports no complaints.  Contractions: Irregular. Vag. Bleeding: None.  Movement: Present. Denies leaking of fluid.   The following portions of the patient's history were reviewed and updated as appropriate: allergies, current medications, past family history, past medical history, past social history, past surgical history and problem list. Problem list updated.  Objective:   Vitals:   07/12/16 0938 07/12/16 0940  BP: (!) 145/122 121/79  Pulse: 67   Weight: 165 lb 3.2 oz (74.9 kg)     Fetal Status: Fetal Heart Rate (bpm): 150 Fundal Height: 38 cm Movement: Present  Presentation: Vertex  General:  Alert, oriented and cooperative. Patient is in no acute distress.  Skin: Skin is warm and dry. No rash noted.   Cardiovascular: Normal heart rate noted  Respiratory: Normal respiratory effort, no problems with respiration noted  Abdomen: Soft, gravid, appropriate for gestational age. Pain/Pressure: Present     Pelvic:  Cervical exam performed Dilation: 1.5 Effacement (%): 20 Station: -3  Extremities: Normal range of motion.  Edema: None  Mental Status: Normal mood and affect. Normal behavior. Normal judgment and thought content.   Assessment and Plan:  Pregnancy: G6P5005 at 6383w1d  1. Supervision of low-risk pregnancy, third trimester - Prenatal Multivit-Min-Fe-FA (PRENATAL VITAMINS) 0.8 MG tablet; Take 1 tablet by mouth daily.  Dispense: 30  tablet; Refill: 12 - ferrous sulfate 325 (65 FE) MG tablet; Take 1 tablet (325 mg total) by mouth daily.  Dispense: 30 tablet; Refill: 3  2. Thrombocytopenia during pregnancy (HCC) - CBC to recheck platelets today   Term labor symptoms and general obstetric precautions including but not limited to vaginal bleeding, contractions, leaking of fluid and fetal movement were reviewed in detail with the patient. Please refer to After Visit Summary for other counseling recommendations.  Return in about 1 week (around 07/19/2016) for LOB.   Marny LowensteinJulie N Logan [place], PA-C

## 2016-07-13 LAB — CBC
HEMATOCRIT: 34.3 % (ref 34.0–46.6)
HEMOGLOBIN: 11.7 g/dL (ref 11.1–15.9)
MCH: 32.1 pg (ref 26.6–33.0)
MCHC: 34.1 g/dL (ref 31.5–35.7)
MCV: 94 fL (ref 79–97)
Platelets: 103 10*3/uL — ABNORMAL LOW (ref 150–379)
RBC: 3.64 x10E6/uL — ABNORMAL LOW (ref 3.77–5.28)
RDW: 13.4 % (ref 12.3–15.4)
WBC: 8 10*3/uL (ref 3.4–10.8)

## 2016-07-19 ENCOUNTER — Ambulatory Visit (INDEPENDENT_AMBULATORY_CARE_PROVIDER_SITE_OTHER): Payer: Self-pay | Admitting: Obstetrics and Gynecology

## 2016-07-19 ENCOUNTER — Encounter: Payer: Self-pay | Admitting: Advanced Practice Midwife

## 2016-07-19 VITALS — BP 132/69 | HR 71 | Wt 166.9 lb

## 2016-07-19 DIAGNOSIS — D696 Thrombocytopenia, unspecified: Secondary | ICD-10-CM

## 2016-07-19 DIAGNOSIS — O99323 Drug use complicating pregnancy, third trimester: Secondary | ICD-10-CM

## 2016-07-19 DIAGNOSIS — O99113 Other diseases of the blood and blood-forming organs and certain disorders involving the immune mechanism complicating pregnancy, third trimester: Secondary | ICD-10-CM

## 2016-07-19 DIAGNOSIS — F1910 Other psychoactive substance abuse, uncomplicated: Secondary | ICD-10-CM

## 2016-07-19 DIAGNOSIS — Z3492 Encounter for supervision of normal pregnancy, unspecified, second trimester: Secondary | ICD-10-CM

## 2016-07-19 DIAGNOSIS — O99119 Other diseases of the blood and blood-forming organs and certain disorders involving the immune mechanism complicating pregnancy, unspecified trimester: Secondary | ICD-10-CM

## 2016-07-19 DIAGNOSIS — B951 Streptococcus, group B, as the cause of diseases classified elsewhere: Secondary | ICD-10-CM

## 2016-07-19 NOTE — Progress Notes (Signed)
Subjective:  Tanya Villegas is a 41 y.o. G6P5005 at 15w1dbeing seen today for ongoing prenatal care.  She is currently monitored for the following issues for this high-risk pregnancy and has Substance abuse affecting pregnancy in third trimester, antepartum; Supervision of low-risk pregnancy, second trimester; Anemia in pregnancy; Gestational thrombocytopenia ([place]; Breast abscess during pregnancy, antepartum; Advanced maternal age in multigravida, third trimester; and Group beta Strep positive on her problem list.  Patient reports no complaints.   .  .  Movement: Present. Denies leaking of fluid.   The following portions of the patient's history were reviewed and updated as appropriate: allergies, current medications, past family history, past medical history, past social history, past surgical history and problem list. Problem list updated.  Objective:   Vitals:   07/19/16 0905  BP: 132/69  Pulse: 71  Weight: 166 lb 14.4 oz (75.7 kg)    Fetal Status: Fetal Heart Rate (bpm): 154   Movement: Present     General:  Alert, oriented and cooperative. Patient is in no acute distress.  Skin: Skin is warm and dry. No rash noted.   Cardiovascular: Normal heart rate noted  Respiratory: Normal respiratory effort, no problems with respiration noted  Abdomen: Soft, gravid, appropriate for gestational age.       Pelvic:  Cervical exam performed        Extremities: Normal range of motion.     Mental Status: Normal mood and affect. Normal behavior. Normal judgment and thought content.   Urinalysis:      Assessment and Plan:  Pregnancy: G6P5005 at 347w1d1. Substance abuse affecting pregnancy in third trimester, antepartum   2. Supervision of low-risk pregnancy, second trimester Labor precautions  3. Benign gestational thrombocytopenia, antepartum (HCC)  - CBC - Comp Met (CMET) - Protein / creatinine ratio, urine  4. Group beta Strep positive Treat while in labor  Term labor symptoms  and general obstetric precautions including but not limited to vaginal bleeding, contractions, leaking of fluid and fetal movement were reviewed in detail with the patient. Please refer to After Visit Summary for other counseling recommendations.  Return in about 1 week (around 07/26/2016) for OB visit.   MiChancy MilroyMD

## 2016-07-19 NOTE — Patient Instructions (Signed)

## 2016-07-20 LAB — CBC
HEMATOCRIT: 36.2 % (ref 34.0–46.6)
HEMOGLOBIN: 12.6 g/dL (ref 11.1–15.9)
MCH: 31.6 pg (ref 26.6–33.0)
MCHC: 34.8 g/dL (ref 31.5–35.7)
MCV: 91 fL (ref 79–97)
Platelets: 110 10*3/uL — ABNORMAL LOW (ref 150–379)
RBC: 3.99 x10E6/uL (ref 3.77–5.28)
RDW: 13.5 % (ref 12.3–15.4)
WBC: 8.1 10*3/uL (ref 3.4–10.8)

## 2016-07-20 LAB — COMPREHENSIVE METABOLIC PANEL
ALT: 12 IU/L (ref 0–32)
AST: 20 IU/L (ref 0–40)
Albumin/Globulin Ratio: 1.5 (ref 1.2–2.2)
Albumin: 3.8 g/dL (ref 3.5–5.5)
Alkaline Phosphatase: 188 IU/L — ABNORMAL HIGH (ref 39–117)
BILIRUBIN TOTAL: 0.3 mg/dL (ref 0.0–1.2)
BUN/Creatinine Ratio: 8 — ABNORMAL LOW (ref 9–23)
BUN: 4 mg/dL — AB (ref 6–20)
CHLORIDE: 98 mmol/L (ref 96–106)
CO2: 25 mmol/L (ref 18–29)
Calcium: 10 mg/dL (ref 8.7–10.2)
Creatinine, Ser: 0.51 mg/dL — ABNORMAL LOW (ref 0.57–1.00)
GFR calc non Af Amer: 122 mL/min/{1.73_m2} (ref >59)
GFR, EST AFRICAN AMERICAN: 141 mL/min/{1.73_m2} (ref >59)
Globulin, Total: 2.6 g/dL (ref 1.5–4.5)
Glucose: 82 mg/dL (ref 65–99)
Potassium: 4.1 mmol/L (ref 3.5–5.2)
Sodium: 138 mmol/L (ref 134–144)
TOTAL PROTEIN: 6.4 g/dL (ref 6.0–8.5)

## 2016-07-20 LAB — PROTEIN / CREATININE RATIO, URINE
CREATININE, UR: 23.9 mg/dL
PROTEIN UR: 4 mg/dL
PROTEIN/CREAT RATIO: 167 mg/g{creat} (ref 0–200)

## 2016-07-25 ENCOUNTER — Ambulatory Visit (INDEPENDENT_AMBULATORY_CARE_PROVIDER_SITE_OTHER): Payer: Self-pay | Admitting: Advanced Practice Midwife

## 2016-07-25 ENCOUNTER — Other Ambulatory Visit: Payer: Self-pay | Admitting: Advanced Practice Midwife

## 2016-07-25 VITALS — BP 121/88 | HR 68 | Wt 167.5 lb

## 2016-07-25 DIAGNOSIS — D696 Thrombocytopenia, unspecified: Secondary | ICD-10-CM

## 2016-07-25 DIAGNOSIS — Z029 Encounter for administrative examinations, unspecified: Secondary | ICD-10-CM

## 2016-07-25 DIAGNOSIS — O09899 Supervision of other high risk pregnancies, unspecified trimester: Secondary | ICD-10-CM | POA: Insufficient documentation

## 2016-07-25 DIAGNOSIS — O99113 Other diseases of the blood and blood-forming organs and certain disorders involving the immune mechanism complicating pregnancy, third trimester: Secondary | ICD-10-CM

## 2016-07-25 DIAGNOSIS — O0990 Supervision of high risk pregnancy, unspecified, unspecified trimester: Secondary | ICD-10-CM

## 2016-07-25 NOTE — Progress Notes (Signed)
Patient PHQ9 score was high, Patient states this is the anniversary of her deceased baby and declined to see Asher MuirJamie.

## 2016-07-25 NOTE — Progress Notes (Signed)
   PRENATAL VISIT NOTE  Subjective:  Tanya Villegas is a 41 y.o. G6P5005 at 7254w0d being seen today for ongoing prenatal care.  She is currently monitored for the following issues for this low-risk pregnancy and has Substance abuse affecting pregnancy in third trimester, antepartum; Supervision of low-risk pregnancy, second trimester; Anemia in pregnancy; Gestational thrombocytopenia (HCC); Breast abscess during pregnancy, antepartum; Advanced maternal age in multigravida, third trimester; and Group beta Strep positive on her problem list.  Patient reports occasional contractions. Scored high on Depression screening, but states her mood is stable and declines visit w/Jamie. Contractions: Irregular. Vag. Bleeding: None.  Movement: Present. Denies leaking of fluid.   The following portions of the patient's history were reviewed and updated as appropriate: allergies, current medications, past family history, past medical history, past social history, past surgical history and problem list. Problem list updated.  Objective:   Vitals:   07/25/16 0832  BP: 121/88  Pulse: 68  Weight: 167 lb 8 oz (76 kg)    Fetal Status: Fetal Heart Rate (bpm): 146 Fundal Height: 39 cm Movement: Present  Presentation: Vertex  General:  Alert, oriented and cooperative. Patient is in no acute distress.  Skin: Skin is warm and dry. No rash noted.   Cardiovascular: Normal heart rate noted  Respiratory: Normal respiratory effort, no problems with respiration noted  Abdomen: Soft, gravid, appropriate for gestational age. Pain/Pressure: Present     Pelvic:  Cervical exam performed Dilation: 2 Effacement (%): 0 Station: -3  Extremities: Normal range of motion.  Edema: None  Mental Status: Normal mood and affect. Normal behavior. Normal judgment and thought content.   Assessment and Plan:  Pregnancy: G6P5005 at 5454w0d  1. Supervision of low-risk pregnancy, second trimester - Postdates testing and 41 week IOL  scheduled  2. Gestational thrombocytopenia stable. BP Nml.   3. 2 vessel cord - Pt had declined growth US's due to cost. Nml growth at 26 weeks. Nml fundal ht. - No change in POC per consult w/ Dr. Erin FullingHarraway-Rhia Blatchford  Term labor symptoms and general obstetric precautions including but not limited to vaginal bleeding, contractions, leaking of fluid and fetal movement were reviewed in detail with the patient. Please refer to After Visit Summary for other counseling recommendations.  Return for Needs nst/afi 4/2 postdates.  Support given.  May call to schedule appt w/ Jamie PRN.  Peds list given.   Dorathy KinsmanVirginia Isador Castille, CNM

## 2016-07-25 NOTE — Patient Instructions (Signed)
AREA PEDIATRIC/FAMILY PRACTICE PHYSICIANS  [facility] [address], [address] [place], [place]  [address] Phone - [phone]   Fax - [phone]  [facility] [address] [place], [place] [address] Phone - [phone]   Fax - [phone]  [facility] [address] [place], [place]  [address] Phone - [phone]   Fax - [phone]  [facility] [address] [place], [place]  [address] Phone - [phone]   Fax - [phone]  [facility] [address] [place], [place]  [address] Phone - [phone]   Fax - [phone]  [facility] [address] [place], [place]  [address] Phone - [phone]   Fax - [phone]  [facility] [address] [place], [place]  [address] Phone - [phone]   Fax - [phone]  [facility] [address] [place], [place]  [address] Phone - [phone]   Fax - [phone]  [facility] [address] [place], [place]  [address] Phone - [phone]   Fax - [phone] [facility] [address] [place], [place]  [address] Phone - [phone]   Fax - [phone]  [facility] [address] [place], [place]  [address] Phone - [phone]   Fax - [phone]  [facility] [address] [place], [place]  [address] Phone - [phone]   Fax - [phone]  [facility] [address] [place], [place]  [address] Phone - [phone]   Fax - [phone]  [facility] [address] [place], [place]  [address] Phone - [phone]  [facility] [address] [place], [place]  [address] Phone - [phone]   Fax - [phone]  [facility] [address] [place], [place]  [address] Phone - [phone]   Fax - [phone]  [facility] [address] [place], [place]  [address] Phone - [phone]   Fax - [phone]  [facility] [address] [place], [place]  [address] Phone - [phone]   Fax - [phone]   [facility] [address] [place], [place]  [address] Phone - [phone]   Fax - [phone]  [facility] [address] [place], [place]  [address] Phone - [phone]   Fax - [phone]  [facility] [address] [place], [place]  [address] Phone - [phone]   Fax - [phone]  [facility] [address] [place], [place]  [address] Phone - [phone]   Fax - [phone]  [facility] [address] , [place]  [address] Phone - [phone]   Fax - [phone] [facility] [address] [place], [place]  [address] Phone - [phone]   Fax - [phone]  [facility] [address] [place], [place]  [address] Phone - [phone]   Fax - [phone]  [facility] [address] [place], [place]  [address] Phone - [phone]   Fax - [phone]  [facility] Charlene Flemming MD [address] [place]  [address] Phone [phone]  Fax [phone]   Braxton Hicks Contractions Contractions of the uterus can occur throughout pregnancy, but they are   not always a sign that you are in labor. You may have practice contractions called Braxton Hicks contractions. These false labor contractions are sometimes confused with true labor. What are Braxton Hicks contractions? Braxton Hicks contractions are tightening movements that occur in the muscles of the uterus before labor. Unlike true labor contractions, these contractions do not result in  opening (dilation) and thinning of the cervix. Toward the end of pregnancy (32-34 weeks), Braxton Hicks contractions can happen more often and may become stronger. These contractions are sometimes difficult to tell apart from true labor because they can be very uncomfortable. You should not feel embarrassed if you go to the hospital with false labor. Sometimes, the only way to tell if you are in true labor is for your health care provider to look for changes in the cervix. The health care provider will do a physical exam and may monitor your contractions. If you are not in true labor, the exam should show that your cervix is not dilating and your water has not broken. If there are no prenatal problems or other health problems associated with your pregnancy, it is completely safe for you to be sent home with false labor. You may continue to have Braxton Hicks contractions until you go into true labor. How can I tell the difference between true labor and false labor?  Differences ? False labor ? Contractions last 30-70 seconds.: Contractions are usually shorter and not as strong as true labor contractions. ? Contractions become very regular.: Contractions are usually irregular. ? Discomfort is usually felt in the top of the uterus, and it spreads to the lower abdomen and low back.: Contractions are often felt in the front of the lower abdomen and in the groin. ? Contractions do not go away with walking.: Contractions may go away when you walk around or change positions while lying down. ? Contractions usually become more intense and increase in frequency.: Contractions get weaker and are shorter-lasting as time goes on. ? The cervix dilates and gets thinner.: The cervix usually does not dilate or become thin. Follow these instructions at home:  Take over-the-counter and prescription medicines only as told by your health care provider.  Keep up with your usual exercises and follow other instructions  from your health care provider.  Eat and drink lightly if you think you are going into labor.  If Braxton Hicks contractions are making you uncomfortable: ? Change your position from lying down or resting to walking, or change from walking to resting. ? Sit and rest in a tub of warm water. ? Drink enough fluid to keep your urine clear or pale yellow. Dehydration may cause these contractions. ? Do slow and deep breathing several times an hour.  Keep all follow-up prenatal visits as told by your health care provider. This is important. Contact a health care provider if:  You have a fever.  You have continuous pain in your abdomen. Get help right away if:  Your contractions become stronger, more regular, and closer together.  You have fluid leaking or gushing from your vagina.  You pass blood-tinged mucus (bloody show).  You have bleeding from your vagina.  You have low back pain that you never had before.  You feel your baby's head pushing down and causing pelvic pressure.  Your baby is not moving inside you as much as it used to. Summary  Contractions that occur before labor are called Braxton Hicks contractions, false labor, or practice contractions.  Braxton Hicks contractions   are usually shorter, weaker, farther apart, and less regular than true labor contractions. True labor contractions usually become progressively stronger and regular and they become more frequent.  Manage discomfort from Braxton Hicks contractions by changing position, resting in a warm bath, drinking plenty of water, or practicing deep breathing. This information is not intended to replace advice given to you by your health care provider. Make sure you discuss any questions you have with your health care provider. Document Released: 04/16/2005 Document Revised: 03/05/2016 Document Reviewed: 03/05/2016 Elsevier Interactive Patient Education  2017 Elsevier Inc.  

## 2016-07-26 ENCOUNTER — Encounter (HOSPITAL_COMMUNITY): Payer: Self-pay | Admitting: *Deleted

## 2016-07-26 ENCOUNTER — Telehealth (HOSPITAL_COMMUNITY): Payer: Self-pay | Admitting: *Deleted

## 2016-07-26 NOTE — Telephone Encounter (Signed)
Preadmission screen  

## 2016-07-29 ENCOUNTER — Encounter (HOSPITAL_COMMUNITY): Payer: Self-pay | Admitting: Advanced Practice Midwife

## 2016-07-29 ENCOUNTER — Inpatient Hospital Stay (HOSPITAL_COMMUNITY)
Admission: AD | Admit: 2016-07-29 | Discharge: 2016-07-31 | DRG: 775 | Disposition: A | Discharge status: Home / Self Care | Payer: Medicaid Other | Source: Ambulatory Visit | Attending: Obstetrics & Gynecology | Admitting: Obstetrics & Gynecology

## 2016-07-29 DIAGNOSIS — O09899 Supervision of other high risk pregnancies, unspecified trimester: Secondary | ICD-10-CM

## 2016-07-29 DIAGNOSIS — Z823 Family history of stroke: Secondary | ICD-10-CM | POA: Diagnosis not present

## 2016-07-29 DIAGNOSIS — Z3493 Encounter for supervision of normal pregnancy, unspecified, third trimester: Secondary | ICD-10-CM | POA: Diagnosis present

## 2016-07-29 DIAGNOSIS — D6959 Other secondary thrombocytopenia: Secondary | ICD-10-CM | POA: Diagnosis present

## 2016-07-29 DIAGNOSIS — D649 Anemia, unspecified: Secondary | ICD-10-CM | POA: Diagnosis present

## 2016-07-29 DIAGNOSIS — Z833 Family history of diabetes mellitus: Secondary | ICD-10-CM | POA: Diagnosis not present

## 2016-07-29 DIAGNOSIS — O09523 Supervision of elderly multigravida, third trimester: Secondary | ICD-10-CM

## 2016-07-29 DIAGNOSIS — O0990 Supervision of high risk pregnancy, unspecified, unspecified trimester: Secondary | ICD-10-CM

## 2016-07-29 DIAGNOSIS — Z8249 Family history of ischemic heart disease and other diseases of the circulatory system: Secondary | ICD-10-CM | POA: Diagnosis not present

## 2016-07-29 DIAGNOSIS — O99013 Anemia complicating pregnancy, third trimester: Secondary | ICD-10-CM | POA: Diagnosis present

## 2016-07-29 DIAGNOSIS — O9912 Other diseases of the blood and blood-forming organs and certain disorders involving the immune mechanism complicating childbirth: Secondary | ICD-10-CM | POA: Diagnosis present

## 2016-07-29 DIAGNOSIS — O99824 Streptococcus B carrier state complicating childbirth: Secondary | ICD-10-CM | POA: Diagnosis present

## 2016-07-29 DIAGNOSIS — Z3A40 40 weeks gestation of pregnancy: Secondary | ICD-10-CM

## 2016-07-29 DIAGNOSIS — O99323 Drug use complicating pregnancy, third trimester: Secondary | ICD-10-CM

## 2016-07-29 DIAGNOSIS — Z87891 Personal history of nicotine dependence: Secondary | ICD-10-CM | POA: Diagnosis not present

## 2016-07-29 LAB — CBC
HEMATOCRIT: 35.9 % — AB (ref 36.0–46.0)
Hemoglobin: 12.3 g/dL (ref 12.0–15.0)
MCH: 31.7 pg (ref 26.0–34.0)
MCHC: 34.3 g/dL (ref 30.0–36.0)
MCV: 92.5 fL (ref 78.0–100.0)
PLATELETS: 104 10*3/uL — AB (ref 150–400)
RBC: 3.88 MIL/uL (ref 3.87–5.11)
RDW: 13.2 % (ref 11.5–15.5)
WBC: 12.7 10*3/uL — AB (ref 4.0–10.5)

## 2016-07-29 LAB — TYPE AND SCREEN
ABO/RH(D): A POS
ANTIBODY SCREEN: NEGATIVE

## 2016-07-29 LAB — RPR: RPR: NONREACTIVE

## 2016-07-29 MED ORDER — OXYCODONE-ACETAMINOPHEN 5-325 MG PO TABS
1.0000 | ORAL_TABLET | ORAL | Status: DC | PRN
Start: 2016-07-29 — End: 2016-07-29
  Administered 2016-07-29: 1 via ORAL
  Filled 2016-07-29: qty 1

## 2016-07-29 MED ORDER — BENZOCAINE-MENTHOL 20-0.5 % EX AERO
1.0000 | INHALATION_SPRAY | CUTANEOUS | Status: DC | PRN
Start: 2016-07-29 — End: 2016-07-31

## 2016-07-29 MED ORDER — SENNOSIDES-DOCUSATE SODIUM 8.6-50 MG PO TABS
2.0000 | ORAL_TABLET | ORAL | Status: DC
  Administered 2016-07-29 – 2016-07-30 (×2): 2 via ORAL
  Filled 2016-07-29 (×2): qty 2

## 2016-07-29 MED ORDER — OXYTOCIN 40 UNITS IN LACTATED RINGERS INFUSION - SIMPLE MED: 2.5000 [IU]/h | INTRAVENOUS | Status: DC

## 2016-07-29 MED ORDER — ZOLPIDEM TARTRATE 5 MG PO TABS: 5.0000 mg | ORAL_TABLET | Freq: Every evening | ORAL | Status: DC | PRN

## 2016-07-29 MED ORDER — OXYCODONE HCL 5 MG PO TABS
5.0000 mg | ORAL_TABLET | Freq: Once | ORAL | Status: AC
  Administered 2016-07-29: 5 mg via ORAL
  Filled 2016-07-29: qty 1

## 2016-07-29 MED ORDER — COCONUT OIL OIL
1.0000 "application " | TOPICAL_OIL | Status: DC | PRN
  Administered 2016-07-30: 1 via TOPICAL
  Filled 2016-07-29: qty 120

## 2016-07-29 MED ORDER — LACTATED RINGERS IV SOLN: 500.0000 mL | INTRAVENOUS | Status: DC | PRN

## 2016-07-29 MED ORDER — ONDANSETRON HCL 4 MG PO TABS: 4.0000 mg | ORAL_TABLET | ORAL | Status: DC | PRN

## 2016-07-29 MED ORDER — ACETAMINOPHEN 325 MG PO TABS
650.0000 mg | ORAL_TABLET | ORAL | Status: DC | PRN
  Administered 2016-07-29 – 2016-07-31 (×4): 650 mg via ORAL
  Filled 2016-07-29 (×4): qty 2

## 2016-07-29 MED ORDER — SIMETHICONE 80 MG PO CHEW: 80.0000 mg | CHEWABLE_TABLET | ORAL | Status: DC | PRN

## 2016-07-29 MED ORDER — SOD CITRATE-CITRIC ACID 500-334 MG/5ML PO SOLN: 30.0000 mL | ORAL | Status: DC | PRN

## 2016-07-29 MED ORDER — ACETAMINOPHEN 325 MG PO TABS: 650.0000 mg | ORAL_TABLET | ORAL | Status: DC | PRN

## 2016-07-29 MED ORDER — DIBUCAINE 1 % RE OINT: 1.0000 "application " | TOPICAL_OINTMENT | RECTAL | Status: DC | PRN

## 2016-07-29 MED ORDER — WITCH HAZEL-GLYCERIN EX PADS
1.0000 | MEDICATED_PAD | CUTANEOUS | Status: DC | PRN
Start: 2016-07-29 — End: 2016-07-31

## 2016-07-29 MED ORDER — TETANUS-DIPHTH-ACELL PERTUSSIS 5-2.5-18.5 LF-MCG/0.5 IM SUSP: 0.5000 mL | Freq: Once | INTRAMUSCULAR | Status: DC

## 2016-07-29 MED ORDER — PRENATAL MULTIVITAMIN CH
1.0000 | ORAL_TABLET | Freq: Every day | ORAL | Status: DC
  Administered 2016-07-29 – 2016-07-31 (×3): 1 via ORAL
  Filled 2016-07-29 (×3): qty 1

## 2016-07-29 MED ORDER — MISOPROSTOL 200 MCG PO TABS
ORAL_TABLET | ORAL | Status: AC
  Administered 2016-07-29: 200 ug via BUCCAL
  Filled 2016-07-29: qty 1

## 2016-07-29 MED ORDER — OXYTOCIN BOLUS FROM INFUSION: 500.0000 mL | Freq: Once | INTRAVENOUS | Status: DC

## 2016-07-29 MED ORDER — MISOPROSTOL 200 MCG PO TABS
200.0000 ug | ORAL_TABLET | Freq: Once | ORAL | Status: AC
  Administered 2016-07-29: 200 ug via BUCCAL

## 2016-07-29 MED ORDER — IBUPROFEN 600 MG PO TABS
600.0000 mg | ORAL_TABLET | Freq: Four times a day (QID) | ORAL | Status: DC
  Administered 2016-07-29 – 2016-07-31 (×10): 600 mg via ORAL
  Filled 2016-07-29 (×10): qty 1

## 2016-07-29 MED ORDER — ONDANSETRON HCL 4 MG/2ML IJ SOLN: 4.0000 mg | INTRAMUSCULAR | Status: DC | PRN

## 2016-07-29 MED ORDER — LIDOCAINE HCL (PF) 1 % IJ SOLN
30.0000 mL | INTRAMUSCULAR | Status: DC | PRN
  Filled 2016-07-29: qty 30

## 2016-07-29 MED ORDER — DIPHENHYDRAMINE HCL 25 MG PO CAPS: 25.0000 mg | ORAL_CAPSULE | Freq: Four times a day (QID) | ORAL | Status: DC | PRN

## 2016-07-29 MED ORDER — LACTATED RINGERS IV SOLN: INTRAVENOUS | Status: DC

## 2016-07-29 MED ORDER — OXYCODONE-ACETAMINOPHEN 5-325 MG PO TABS: 2.0000 | ORAL_TABLET | ORAL | Status: DC | PRN

## 2016-07-29 MED ORDER — ONDANSETRON HCL 4 MG/2ML IJ SOLN: 4.0000 mg | Freq: Four times a day (QID) | INTRAMUSCULAR | Status: DC | PRN

## 2016-07-29 MED ORDER — MISOPROSTOL 200 MCG PO TABS: 100.0000 ug | ORAL_TABLET | Freq: Once | ORAL | Status: DC

## 2016-07-29 NOTE — Clinical Social Work Maternal (Addendum)
CLINICAL SOCIAL WORK MATERNAL/CHILD NOTE  Patient Details  Name: Tanya Villegas MRN: [id number] Date of Birth: [birth year]  Date:  07/29/2016  Clinical Social Worker Initiating Note:  Laurey Arrow Date/ Time Initiated:  07/29/16/1059     Child's Name:  Magdalene Patricia   Legal Guardian:  Mother (FOB Denice Paradise 04/10/1977)   Need for Interpreter:  None   Date of Referral:  07/29/16     Reason for Referral:  Current Substance Use/Substance Use During Pregnancy  (hx of marijuana [place] during pregnancy)   Referral Source:  Central Nursery   Address:  [address] [place] [address]  Phone number:  [phone]   Household Members:  Self, Minor Children, Significant Other, Parents, Relatives   Natural Supports (not living in the home):  Immediate Family, Extended Family   Professional Supports: None   Employment: Full-time   Type of Work: [employer]   Education:  Database administrator Resources:  Medicaid   Other Resources:      Cultural/Religious Considerations Which May Impact Care:  None Reported  Strengths:  Ability to meet basic needs , Other (Comment) (MOB's Mother and FOB ar supportive.)   Risk Factors/Current Problems:  Substance Use    Cognitive State:  Able to Concentrate , Alert , Linear Thinking , Insightful    Mood/Affect:  Tearful , Sad , Irritable , Angry , Agitated    CSW Assessment: CSW met with MOB to complete an assessment for hx THC use during pregnancy.  MOB was receptive to meeting with CSW and had requested a meeting with CSW prior to CSW's arrival.  When CSW arrived, MOB was resting in bed engaging in skin to skin with infant. With MOB's permission, CSW asked MOB's guest (MOB's mother and FOB) to leave the room in effort to meet with MOB in private. CSW acknowledged that MOB requested to met with CSW and CSW inquired about MOB's request.  MOB communicated that MOB was not aware that MOB had a positive UDS during pregnancy and  inquired about the lack of someone communicating the information to MOB. CSW was unable to provided MOB with any explanation and encouraged MOB to speak with MOB's physician at [facility]. CSW inquired about MOB's substance use hx, and MOB acknowledged the use of marijuana during pregnancy. MOB reported utilizing marijuana to assist MOB with coping with MOB's anxiety. CSW informed MOB of the hospital's drug screen policy, and informed MOB of the 2 screenings for the infant. MOB appeared tearful and expressed frustration about the policy.  MOB stated the MOB's last use of marijuana was 2 days ago.  MOB communicated that MOB used 2 days ago to help MOB deal with the loss of MOB's child; 9 years ago (death anniversary date was 06-Aug-2022). CSW was empathic and offered MOB outpatient resources; MOB declined. CSW shared with MOB that if the infant's UDS or CDS is positive without an explanation, CSW will make a report to [facility] CPS. MOB continued to be tearful and expressed concerns about having her infant screened.   CSW offered MOB resources and referrals for SA, and MOB declined. MOB denied having CPS hx and went on a rant about being a good provider for MOB's children with out any state assistance. CSW praised MOB for being a responsible parent and encouraged MOB to continue.  FOB and MOB's mother re-entered the room, and MOB gave CSW permission to continue the assessment with them present. MOB gave CSW verbal permission to discuss  infant's UDS and CDS with FOB and MOB's mother. MOB's mother requested a telephone call from [place] after CSW receives UDS results. MOB communicated that MOB wanted to leave AMA with infant and CSW encouraged MOB not to.  CSW informed MOB of the hospital's policy if MOB wanted to leave AMA.  FOB reported that MOB was "just upset and is not going anywhere".   CSW denied a MH hx and communicated that MOB's decrease in mood comes from the loss of MOB's child 9 years ago. CSW educated  MOB about PPD. CSW informed MOB of possible supports and interventions to decrease PPD.  CSW also encouraged MOB to seek medical attention if needed for increased signs and symptoms for PPD.  CSW thanked MOB for meeting with CSW and provided MOB with CSW contact information.   At 2:00pm, CSW spoke with MOB's mother via telephone and informed her that the infant had a positive UDS for THC.  CSW made a report to [place] (Wes Early was the intake worker). MOB's mother thanked CSW for the updated information. CPS will follow up with family within 72 hours.   There are no barriers to d/c.    CSW Plan/Description:  Child Protective Service Report , Information/Referral to [employer] , [employer] , No Further Intervention Required/No Barriers to Discharge (CSW will provide CPS with the results of infant's CDS when available. )   Laurey Arrow, MSW, LCSW Clinical Social Work [phone]   Dimple Nanas, [place] 07/29/2016, 2:03 PM

## 2016-07-29 NOTE — Lactation Note (Signed)
This note was copied from a baby's chart. Lactation Consultation Note  Patient Name: Tanya Villegas [id number] Date: 07/29/2016 Reason for consult: Initial assessment (mom aware to call for feeding assistance and latch score )  Baby is 13 hours old, and has been to the breast several times and recently breast fed around 1400 and attempted at 1500 . Presently sleeping in  the bedside crib.  Per mom breast fed 5 others , and the last baby fed for 3 year and old could breast feed on the left breast . The milk wouldn't let down on the right. Per mom the nurse was hand expressed small amount off the right and the baby has breast alittle bit on that side.  LC recommended when the baby is showing feeding cues to call on the nurses light so the [facility] can assess feeding and latch score.  Mother informed of post-discharge support and given phone number to the lactation department, including services for phone call assistance; out-patient appointments; and breastfeeding support group. List of other breastfeeding resources in the community given in the handout. Encouraged mother to call for problems or concerns related to breastfeeding.     Maternal Data Does the patient have breastfeeding experience prior to this delivery?: Yes  Feeding Feeding Type:  (baby recently fed ) Length of feed: 3 min  LATCH Score/Interventions / Latch score from the [facility] RN  Latch: Repeated attempts needed to sustain latch, nipple held in mouth throughout feeding, stimulation needed to elicit sucking reflex.  Audible Swallowing: None Intervention(s): Skin to skin  Type of Nipple: Everted at rest and after stimulation  Comfort (Breast/Nipple): Soft / non-tender     Hold (Positioning): Assistance needed to correctly position infant at breast and maintain latch. Intervention(s): Breastfeeding basics reviewed  LATCH Score: 6  Lactation Tools Discussed/Used WIC Program: No (per mom not active )   Consult  Status Consult Status: Follow-up Date: 07/29/16 Follow-up type: In-patient    Matilde Sprang Anayi Bricco 07/29/2016, 3:51 PM

## 2016-07-29 NOTE — H&P (Signed)
Tanya Villegas is a 41 y.o. female presenting for uterine contractions and water which started leaking as she walked into [facility].  Pregnancy has been followed in clinic and remarkable for:  Patient Active Problem List   Diagnosis Date Noted  . Two vessel umbilical cord, antepartum 07/25/2016  . Group beta Strep positive 07/09/2016  . Advanced maternal age in multigravida, third trimester 06/21/2016  . Supervision of high risk pregnancy, antepartum 04/24/2016  . Anemia in pregnancy 04/24/2016  . Gestational thrombocytopenia (HCC) 04/24/2016  . Breast abscess during pregnancy, antepartum 04/24/2016  . Substance abuse affecting pregnancy in third trimester, antepartum 04/11/2016   . OB History    Gravida Para Term Preterm AB Living   0 0 5   SAB TAB Ectopic Multiple Live Births   0 0 0 0 5     Past Medical History:  Diagnosis Date  . Anemia   . Anxiety    after 5th preg  . Depression    after death of child, ok now  . Pregnancy induced hypertension   . Vaginal Pap smear, abnormal    ok since   Past Surgical History:  Procedure Laterality Date  . NO PAST SURGERIES     Family History: family history includes Diabetes in her maternal grandmother and mother; Hypertension in her mother; Stroke in her maternal grandfather. Social History:  reports that she quit smoking about 10 months ago. She has never used smokeless tobacco. She reports that she does not drink alcohol or use drugs.     Maternal Diabetes: No Genetic Screening: Declined Maternal Ultrasounds/Referrals: 2 vessel cord Fetal Ultrasounds or other Referrals:  None Maternal Substance Abuse:  Yes:  Type: Marijuana Significant Maternal Medications:  None Significant Maternal Lab Results:  Lab values include: Group B Strep positive Other Comments:  None  Review of Systems  Constitutional: Negative for chills and fever.  Eyes: Negative for blurred vision.  Respiratory: Negative for shortness of breath.    Cardiovascular: Negative for leg swelling.  Gastrointestinal: Positive for abdominal pain. Negative for constipation, diarrhea, nausea and vomiting.  Neurological: Negative for dizziness.   Maternal Medical History:  Reason for admission: Contractions.  Nausea.  Contractions: Onset was 1-2 hours ago.   Frequency: regular.   Perceived severity is strong.    Fetal activity: Perceived fetal activity is normal.   Last perceived fetal movement was within the past hour.    Prenatal complications: Substance abuse.   No bleeding, PIH or preterm labor.   Prenatal Complications - Diabetes: none.      Blood pressure 132/71, pulse 71, last menstrual period 11/17/2015. Maternal Exam:  Uterine Assessment: Contraction strength is firm.  Contraction frequency is regular.   Abdomen: Patient reports no abdominal tenderness. Fetal presentation: vertex  Introitus: Normal vulva. Normal vagina.  Ferning test: not done.  Nitrazine test: not done. Amniotic fluid character: clear.  Pelvis: adequate for delivery.   Cervix: Cervix evaluated by digital exam.     Fetal Exam Fetal Monitor Review: Baseline rate: Delivered before monitoring.      Physical Exam  Constitutional: She is oriented to person, place, and time. She appears well-developed and well-nourished. No distress.  HENT:  Head: Normocephalic.  Cardiovascular: Normal rate and regular rhythm.   Respiratory: Effort normal. No respiratory distress.  GI: Soft. She exhibits no distension. There is no tenderness. There is no rebound and no guarding.  Genitourinary:  Genitourinary Comments: Moderate lochia after delivery of placenta Intact perineum  Musculoskeletal: Normal  range of motion.  Neurological: She is alert and oriented to person, place, and time.  Skin: Skin is warm and dry.  Psychiatric: She has a normal mood and affect.    Prenatal labs: ABO, Rh: --/--/A POS (12/06 1120) Antibody: NEG (12/06 1110) Rubella: 2.06  (12/06 1109) RPR: NON REAC (01/09 1007)  HBsAg: Negative (12/06 1110)  HIV: NONREACTIVE (01/09 1007)  GBS: Positive (03/08 0000)   Assessment/Plan: Single IUP at [redacted]w[redacted]d Active labor with precipitous delivery (see delivery note) GBS Positive, unable to give antibiotics  Admit to Mother baby unit Routine orders    Wynelle Bourgeois 07/29/2016, 3:03 AM

## 2016-07-29 NOTE — MAU Note (Signed)
Pt reports contractions started about 30 min ago. Water broke when walking into hospital. Pt states she feels the baby is coming. Assisted pt to bed. crownnig noted with contraction. Midwife called to attend delivery.

## 2016-07-29 NOTE — MAU Note (Signed)
NSVD of live female by K.Kamsiyochukwu Buist,RN with tight nucal cord x1. Baby placed skin to skin.

## 2016-07-29 NOTE — Progress Notes (Signed)
Cut & pasted from baby's chart. Mob upset & questioned why we were going to collect urine on baby. RN told her it was because it is a routine test for multiple reasons, and the RN double checked the mob chart. RN relayed to pt that she tested positive for MJ 04/04/16. MOB stated that no one told her that she was being tested. MOB stated she wants to talk to someone else. RN informed house coverage who asked RN to contact CSW.

## 2016-07-30 ENCOUNTER — Encounter (HOSPITAL_COMMUNITY): Payer: Self-pay | Admitting: *Deleted

## 2016-07-30 ENCOUNTER — Other Ambulatory Visit: Payer: Self-pay | Admitting: Student

## 2016-07-30 MED ORDER — OXYCODONE HCL 5 MG PO TABS
5.0000 mg | ORAL_TABLET | ORAL | Status: DC | PRN
  Administered 2016-07-30: 5 mg via ORAL
  Filled 2016-07-30: qty 1

## 2016-07-30 NOTE — Progress Notes (Signed)
Post Partum Day 1 Subjective:  Tanya Villegas is a 41 y.o. [id number] [redacted]w[redacted]d s/p precipitous delivery.  No acute events overnight.  Pt denies problems with ambulating, voiding or po intake.  She denies nausea or vomiting.  Pain is well controlled. Lochia Minimal.  Plan for birth control is vasectomy.  Method of Feeding: breast  Objective: Blood pressure 133/82, pulse 61, temperature 97.8 F (36.6 C), temperature source Oral, resp. rate 18, last menstrual period 11/17/2015, SpO2 100 %, unknown if currently breastfeeding.  Physical Exam:  General: alert, cooperative and no distress Lochia:normal flow Chest: normal WOB Heart: Regular rate Abdomen: +BS, soft, mild TTP (appropriate) Uterine Fundus: firm DVT Evaluation: No evidence of DVT seen on physical exam. Extremities: trace edema   Recent Labs  07/29/16 0434  HGB 12.3  HCT 35.9*    Assessment/Plan:  ASSESSMENT: Tanya Villegas is a 41 y.o. [id number] [redacted]w[redacted]d s/p NSVD. GBS positive, not treated and gestational thrombocytopenia  Plan for discharge tomorrow Continue routine PP care Breastfeeding support PRN  LOS: 1 day   Almon Hercules 07/30/2016, 7:46 AM

## 2016-07-30 NOTE — Lactation Note (Signed)
This note was copied from a baby's chart. Lactation Consultation Note  Patient Name: Girl Velora Horstman [id number] Date: 07/30/2016 Reason for consult: Follow-up assessment Baby at 34 hr of life. Upon entry baby was sleeping sts with mom. Experienced bf mom reports baby is latching well. She has been putting baby to both breast but does not think the L breast is making as much milk as the R. Encouraged her to continue using both. Mom can manually express, suggested she spoon feed expressed milk after bf. She does not have a pump at home. Given Harmony. She will post pump as needed. Discussed baby behavior, feeding frequency, voids, wt loss, breast changes, and nipple care. Given coconut oil for sore R nipple. No skin break down or bruising was noted on either nipple at this visit. Parents are aware of lactation services and support group.    Maternal Data    Feeding Feeding Type: Breast Fed Length of feed: 15 min  LATCH Score/Interventions                      Lactation Tools Discussed/Used Pump Review: Setup, frequency, and cleaning;Milk Storage Initiated by:: ES Date initiated:: 07/30/16   Consult Status Consult Status: Follow-up Date: 07/31/16 Follow-up type: In-patient    Rulon Eisenmenger 07/30/2016, 12:37 PM

## 2016-07-30 NOTE — Progress Notes (Signed)
Post Partum Day #1  Subjective: no complaints, up ad lib, voiding and tolerating PO  Objective: Blood pressure 133/82, pulse 61, temperature 97.8 F (36.6 C), temperature source Oral, resp. rate 18, last menstrual period 11/17/2015, SpO2 100 %, unknown if currently breastfeeding.  Physical Exam:  General: alert, cooperative and no distress Lochia: appropriate Uterine Fundus: firm Incision: none DVT Evaluation: No evidence of DVT seen on physical exam. No cords or calf tenderness. No significant calf/ankle edema.   Recent Labs  07/29/16 0434  HGB 12.3  HCT 35.9*    Assessment/Plan: Plan for discharge tomorrow, Breastfeeding and Contraception Vasectomy planned.     LOS: 1 day   Roe Coombs, CNM 07/30/2016, 7:55 AM

## 2016-07-31 MED ORDER — IBUPROFEN 600 MG PO TABS: 600.0000 mg | ORAL_TABLET | Freq: Four times a day (QID) | ORAL | 0 refills | Status: AC

## 2016-07-31 NOTE — Lactation Note (Signed)
This note was copied from a baby's chart. Lactation Consultation Note: Experienced BF mom, Reports breasts are very full this morning. Offered DEBP to use while she is here. Reviewed setup, use and cleaning of of pump pieces.Mom started pumping, then baby woke. Latched to right breast. That nipple is larger than left. Reviewed wide open mouth. Suggested holding the breast throughout the feeding with good breast massage and compression. Baby still nursing as I left room. Encouraged to pump after nursing. Ice packs given with instructions. No questions at present. Reviewed our phone number, OP appointments and BFSG as resources for support after DC. To call prn  Patient Name: Tanya Villegas [id number] Date: 07/31/2016 Reason for consult: Follow-up assessment   Maternal Data Formula Feeding for Exclusion: No Has patient been taught Hand Expression?: Yes Does the patient have breastfeeding experience prior to this delivery?: Yes  Feeding Feeding Type: Breast Fed  LATCH Score/Interventions Latch: Grasps breast easily, tongue down, lips flanged, rhythmical sucking.  Audible Swallowing: A few with stimulation  Type of Nipple: Everted at rest and after stimulation  Comfort (Breast/Nipple): Filling, red/small blisters or bruises, mild/mod discomfort  Problem noted: Filling;Mild/Moderate discomfort  Hold (Positioning): No assistance needed to correctly position infant at breast. Intervention(s): Breastfeeding basics reviewed;Support Pillows  LATCH Score: 8  Lactation Tools Discussed/Used WIC Program: No Pump Review: Setup, frequency, and cleaning Initiated by:: Dw Date initiated:: 07/31/16   Consult Status Consult Status: Complete    Pamelia Hoit 07/31/2016, 9:48 AM

## 2016-07-31 NOTE — Discharge Summary (Signed)
OB Discharge Summary  Patient Name: Tanya Villegas DOB: May 15, 1975 MRN: [id number]  Date of admission: 07/29/2016 Delivering MD: Aviva Signs   Date of discharge: 07/31/2016  Admitting diagnosis: 40 wks labor Intrauterine pregnancy: [redacted]w[redacted]d     Secondary diagnosis:Active Problems:   Normal labor  Additional problems:Grand multipara, precipitous delivery in the MAU     Discharge diagnosis: Term Pregnancy Delivered                                                                      Complications: None  Hospital course:  Onset of Labor With Vaginal Delivery     41 y.o. yo [id number] at [redacted]w[redacted]d was admitted in Active Labor on 07/29/2016. Patient had an uncomplicated labor course as follows:  Membrane Rupture Time/Date: 2:10 AM ,07/29/2016   Intrapartum Procedures: Episiotomy: None [1]                                         Lacerations:     Patient had a delivery of a Viable infant. 07/29/2016  Information for the patient's newborn:  Sharaine, Delange [[id number]]  Delivery Method: Vaginal, Spontaneous Delivery (Filed from Delivery Summary)    Pateint had an uncomplicated postpartum course.  She is ambulating, tolerating a regular diet, passing flatus, and urinating well. Patient is discharged home in stable condition on 07/31/16.   Physical exam  Vitals:   07/29/16 2000 07/30/16 0545 07/30/16 1728 07/31/16 0520  BP: 131/74 133/82 140/80 127/76  Pulse: 66 61 (!) 56 70  Resp: Temp: 98.3 F (36.8 C) 97.8 F (36.6 C) 98 F (36.7 C) 98.3 F (36.8 C)  TempSrc: Oral Oral Oral Oral  SpO2:       General: alert Lochia: appropriate Uterine Fundus: firm Incision: N/A DVT Evaluation: No evidence of DVT seen on physical exam. Labs: Lab Results  Component Value Date   WBC 12.7 (H) 07/29/2016   HGB 12.3 07/29/2016   HCT 35.9 (L) 07/29/2016   MCV 92.5 07/29/2016   PLT 104 (L) 07/29/2016   CMP Latest Ref Rng & Units 07/19/2016  Glucose 65 - 99 mg/dL 82  BUN 6 -  20 mg/dL 4(L)  Creatinine 1.30 - 1.00 mg/dL 8.65(H)  Sodium 846 - 962 mmol/L 138  Potassium 3.5 - 5.2 mmol/L 4.1  Chloride 96 - 106 mmol/L 98  CO2 18 - 29 mmol/L 25  Calcium 8.7 - 10.2 mg/dL 95.2  Total Protein 6.0 - 8.5 g/dL 6.4  Total Bilirubin 0.0 - 1.2 mg/dL 0.3  Alkaline Phos 39 - 117 IU/L 188(H)  AST 0 - 40 IU/L 20  ALT 0 - 32 IU/L 12    Discharge instruction: per After Visit Summary and "Baby and Me Booklet".  After Visit Meds:  Allergies as of 07/31/2016   No Known Allergies     Medication List    TAKE these medications   acetaminophen 500 MG tablet Commonly known as:  TYLENOL Take 500 mg by mouth every 6 (six) hours as needed for mild pain, moderate pain or headache.   ferrous sulfate 325 (65 FE) MG tablet  Take 1 tablet (325 mg total) by mouth daily.   ibuprofen 600 MG tablet Commonly known as:  ADVIL,MOTRIN Take 1 tablet (600 mg total) by mouth every 6 (six) hours.   loratadine 10 MG tablet Commonly known as:  CLARITIN Take 10 mg by mouth daily as needed for allergies.   Prenatal Vitamins 0.8 MG tablet Take 1 tablet by mouth daily.   ranitidine 150 MG tablet Commonly known as:  ZANTAC Take 1 tablet (150 mg total) by mouth at bedtime.       Diet: routine diet  Activity: Advance as tolerated. Pelvic rest for 6 weeks.   Outpatient follow up:6 weeks Follow up Appt:No future appointments. Follow up visit: No Follow-up on file.  Postpartum contraception: None, Her FOB plans a vasectomy  Newborn Data: Live born female  Birth Weight: 6 lb 10.2 oz (3010 g) APGAR: 9, 9  Baby Feeding: Breast Disposition:home with mother   07/31/2016 Allie Bossier, MD

## 2016-07-31 NOTE — Discharge Instructions (Signed)

## 2016-08-01 ENCOUNTER — Inpatient Hospital Stay (HOSPITAL_COMMUNITY): Admission: RE | Admit: 2016-08-01 | Payer: Medicaid Other | Source: Ambulatory Visit

## 2016-08-07 ENCOUNTER — Encounter: Payer: Self-pay | Admitting: Family Medicine

## 2016-08-15 ENCOUNTER — Telehealth: Payer: Self-pay | Admitting: *Deleted

## 2016-08-15 DIAGNOSIS — O9102 Infection of nipple associated with the puerperium: Principal | ICD-10-CM

## 2016-08-15 DIAGNOSIS — B3789 Other sites of candidiasis: Secondary | ICD-10-CM

## 2016-08-15 NOTE — Telephone Encounter (Signed)
Patient left message. Got prenatal care here. Her daughter is now 76 weeks old and has thrush. Daughter was given meds but she was told she needed to be treated as well. Please return her call.

## 2016-08-16 MED ORDER — FLUCONAZOLE 150 MG PO TABS: 150.0000 mg | ORAL_TABLET | ORAL | 0 refills | Status: AC

## 2016-08-16 NOTE — Telephone Encounter (Signed)
Called patient. Notified her that I had spoken with Dr Macon Large about the thrush issue and she recommended diflucan,  tab every three days for 3 total doses. Prescription sent to her pharmacy. Patient voiced understanding.

## 2016-08-29 ENCOUNTER — Telehealth: Payer: Self-pay

## 2016-08-29 NOTE — Telephone Encounter (Signed)
Called patient in regards to her paperwork [facility]) paperwork that was drop off. Patient will need to submit additional copies because she completed the section where the provider is supposed to fill out.

## 2016-09-03 ENCOUNTER — Ambulatory Visit: Payer: Self-pay | Admitting: Family Medicine

## 2016-09-25 ENCOUNTER — Encounter: Payer: Self-pay | Admitting: Family Medicine

## 2016-09-25 ENCOUNTER — Ambulatory Visit: Payer: Self-pay | Admitting: Advanced Practice Midwife

## 2016-10-17 NOTE — Progress Notes (Signed)
10/17/16 Addendum:  FMLA paperwork completed.
